# Patient Record
Sex: Female | Born: 1985 | Race: White | Hispanic: No | Smoking: Current every day smoker
Health system: Southern US, Community
[De-identification: ages and names within clinical notes are randomized; demographics above are authoritative.]

## PROBLEM LIST (undated history)

## (undated) ENCOUNTER — Emergency Department (HOSPITAL_BASED_OUTPATIENT_CLINIC_OR_DEPARTMENT_OTHER): Payer: Self-pay | Source: Home / Self Care

## (undated) DIAGNOSIS — J45909 Unspecified asthma, uncomplicated: Secondary | ICD-10-CM

## (undated) HISTORY — PX: WISDOM TOOTH EXTRACTION: SHX21

## (undated) HISTORY — PX: CYST REMOVAL HAND: SHX6279

---

## 2012-12-07 ENCOUNTER — Emergency Department (HOSPITAL_BASED_OUTPATIENT_CLINIC_OR_DEPARTMENT_OTHER)
Admission: EM | Admit: 2012-12-07 | Discharge: 2012-12-07 | Disposition: A | Discharge status: Home / Self Care | Payer: Self-pay | Attending: Emergency Medicine | Admitting: Emergency Medicine

## 2012-12-07 ENCOUNTER — Emergency Department (HOSPITAL_BASED_OUTPATIENT_CLINIC_OR_DEPARTMENT_OTHER): Payer: Self-pay

## 2012-12-07 ENCOUNTER — Encounter (HOSPITAL_BASED_OUTPATIENT_CLINIC_OR_DEPARTMENT_OTHER): Payer: Self-pay | Admitting: *Deleted

## 2012-12-07 DIAGNOSIS — Z880 Allergy status to penicillin: Secondary | ICD-10-CM | POA: Insufficient documentation

## 2012-12-07 DIAGNOSIS — J45909 Unspecified asthma, uncomplicated: Secondary | ICD-10-CM | POA: Insufficient documentation

## 2012-12-07 DIAGNOSIS — F17200 Nicotine dependence, unspecified, uncomplicated: Secondary | ICD-10-CM | POA: Insufficient documentation

## 2012-12-07 DIAGNOSIS — IMO0001 Reserved for inherently not codable concepts without codable children: Secondary | ICD-10-CM | POA: Insufficient documentation

## 2012-12-07 DIAGNOSIS — M79609 Pain in unspecified limb: Secondary | ICD-10-CM | POA: Insufficient documentation

## 2012-12-07 DIAGNOSIS — M79642 Pain in left hand: Secondary | ICD-10-CM

## 2012-12-07 DIAGNOSIS — M6740 Ganglion, unspecified site: Secondary | ICD-10-CM | POA: Insufficient documentation

## 2012-12-07 DIAGNOSIS — M7989 Other specified soft tissue disorders: Secondary | ICD-10-CM | POA: Insufficient documentation

## 2012-12-07 HISTORY — DX: Unspecified asthma, uncomplicated: J45.909

## 2012-12-07 MED ORDER — TRAMADOL HCL 50 MG PO TABS: 50.0000 mg | ORAL_TABLET | Freq: Four times a day (QID) | ORAL | Status: DC | PRN

## 2012-12-07 MED ORDER — IBUPROFEN 800 MG PO TABS
800.0000 mg | ORAL_TABLET | Freq: Once | ORAL | Status: AC
  Administered 2012-12-07: 800 mg via ORAL
  Filled 2012-12-07: qty 1

## 2012-12-07 NOTE — ED Provider Notes (Signed)
History     CSN: [id number]  Arrival date & time 12/07/12  1455   First MD Initiated Contact with Patient 12/07/12 1501      Chief Complaint  Patient presents with  . Hand Pain    (Consider location/radiation/quality/duration/timing/severity/associated sxs/prior treatment) HPI Comments: 1 week history of dorsal left hand pain without trauma. Number scan. Pain travels from the dorsum of the hand to the wrist causing some intermittent tingling in her fingers. No weakness or numbness. No fevers no pain in any other joints. She states she "busted a cyst" a week ago the pain started after that. 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  Patient is a 27 y.o. female presenting with hand pain. The history is provided by the patient.  Hand Pain This is a new problem. The current episode started more than 1 week ago. The problem occurs constantly. The problem has not changed since onset.Pertinent negatives include no chest pain, no abdominal pain, no headaches and no shortness of breath. Nothing aggravates the symptoms. Nothing relieves the symptoms. She has tried acetaminophen for the symptoms. The treatment provided mild relief.    Past Medical History  Diagnosis Date  . Asthma     History reviewed. No pertinent past surgical history.  History reviewed. No pertinent family history.  History  Substance Use Topics  . Smoking status: Current Every Day Smoker -- 0.50 packs/day    Types: Cigarettes  . Smokeless tobacco: Not on file  . Alcohol Use: No    OB History   Grav Para Term Preterm Abortions TAB SAB Ect Mult Living                  Review of Systems  Constitutional: Negative for fever, activity change and appetite change.  HENT: Negative for congestion and rhinorrhea.   Respiratory: Negative for cough, chest tightness and shortness of breath.   Cardiovascular: Negative for chest pain.  Gastrointestinal: Negative for nausea,  vomiting and abdominal pain.  Genitourinary: Negative for dysuria.  Musculoskeletal: Positive for myalgias, joint swelling and arthralgias.  Neurological: Negative for headaches.  A complete 10 system review of systems was obtained and all systems are negative except as noted in the HPI and PMH.    Allergies  Penicillins; Percocet; and Vicodin  Home Medications   Current Outpatient Rx  Name  Route  Sig  Dispense  Refill  . traMADol (ULTRAM) 50 MG tablet   Oral   Take 1 tablet (50 mg total) by mouth every 6 (six) hours as needed for pain.   15 tablet   0     BP 133/82  Pulse 92  Temp(Src) 98.5 F (36.9 C) (Oral)  Resp 18  Ht 5\' 2"  (1.575 m)  Wt 160 lb (72.576 kg)  BMI 29.26 kg/m2  SpO2 97%  LMP 11/23/2012  Physical Exam  Constitutional: She is oriented to person, place, and time. She appears well-developed and well-nourished. No distress.  HENT:  Head: Normocephalic and atraumatic.  Mouth/Throat: Oropharynx is clear and moist. No oropharyngeal exudate.  Eyes: Conjunctivae and EOM are normal. Pupils are equal, round, and reactive to light.  Neck: Normal range of motion. Neck supple.  Cardiovascular: Normal rate, regular rhythm and normal heart sounds.   No murmur heard. Pulmonary/Chest: Effort normal and breath sounds normal. No respiratory distress.  Abdominal: Soft. There is no tenderness. There is no rebound and no guarding.  Musculoskeletal: Normal range of motion. She  exhibits tenderness. She exhibits no edema.  TTP dorsal L hand. Nodule cyst on dorsal surface without induration, edema, fluctuance or erythema.  +2 radial pulse, cardinal hand movements intact  Neurological: She is alert and oriented to person, place, and time. No cranial nerve deficit. She exhibits normal muscle tone. Coordination normal.  Skin: Skin is warm.    ED Course  Procedures (including critical care time)  Labs Reviewed - No data to display Dg Wrist 2 Views Left  12/07/2012    *RADIOLOGY REPORT*  Clinical Data: Chronic wrist pain, recurrent painful cyst  LEFT WRIST - 2 VIEW  Comparison: Concurrently obtained radiographs of the left hand  Findings: Normal bony mineralization.  No aggressive lytic or blastic osseous lesion.  No abnormal periosteal reaction.  No focal soft tissue abnormality.  No acute fracture or malalignment.  The carpus is intact and the joint spaces are maintained.  IMPRESSION: Normal wrist   Original Report Authenticated By: Malachy Moan, M.D.   Dg Hand Complete Left  12/07/2012   *RADIOLOGY REPORT*  Clinical Data: Chronic left wrist pain, history of recurrent cyst  LEFT HAND - COMPLETE 3+ VIEW  Comparison: Concurrently obtained radiographs of the right wrist  Findings: Normal bony mineralization.  No aggressive lytic or blastic osseous lesion.  No abnormal periosteal reaction.  No acute fracture or malalignment.  No focal soft tissue swelling abnormality.  Joint spaces are maintained.  No inflammatory erosions.  IMPRESSION: Normal study.   Original Report Authenticated By: Malachy Moan, M.D.     1. Hand pain, left   2. Ganglion cyst       MDM  Dorsal hand pain, history of ganglion cyst. Neurovascularly intact. Appears to have cyst on dorsal hand.  Xrays negative for fracture or dislocation.  Patient reports allergies to vicodin and percocet.  She drove herself to ED.  Will treat with NSAIDs. Followup with hand surgery for management of ganglion cyst.      Glynn Octave, MD 12/07/12 1556

## 2012-12-07 NOTE — ED Notes (Signed)
Pt c/o left hand pain x 1 week

## 2012-12-13 ENCOUNTER — Encounter (HOSPITAL_COMMUNITY): Payer: Self-pay | Admitting: Emergency Medicine

## 2012-12-13 ENCOUNTER — Emergency Department (HOSPITAL_COMMUNITY)
Admission: EM | Admit: 2012-12-13 | Discharge: 2012-12-13 | Disposition: A | Discharge status: Home / Self Care | Payer: Self-pay | Attending: Emergency Medicine | Admitting: Emergency Medicine

## 2012-12-13 DIAGNOSIS — M6740 Ganglion, unspecified site: Secondary | ICD-10-CM | POA: Insufficient documentation

## 2012-12-13 DIAGNOSIS — F17200 Nicotine dependence, unspecified, uncomplicated: Secondary | ICD-10-CM | POA: Insufficient documentation

## 2012-12-13 DIAGNOSIS — R21 Rash and other nonspecific skin eruption: Secondary | ICD-10-CM | POA: Insufficient documentation

## 2012-12-13 DIAGNOSIS — M67432 Ganglion, left wrist: Secondary | ICD-10-CM

## 2012-12-13 DIAGNOSIS — Z880 Allergy status to penicillin: Secondary | ICD-10-CM | POA: Insufficient documentation

## 2012-12-13 DIAGNOSIS — J45909 Unspecified asthma, uncomplicated: Secondary | ICD-10-CM | POA: Insufficient documentation

## 2012-12-13 MED ORDER — KETOROLAC TROMETHAMINE 10 MG PO TABS: 10.0000 mg | ORAL_TABLET | Freq: Four times a day (QID) | ORAL | Status: DC | PRN

## 2012-12-13 MED ORDER — KETOROLAC TROMETHAMINE 60 MG/2ML IM SOLN
30.0000 mg | Freq: Once | INTRAMUSCULAR | Status: AC
  Administered 2012-12-13: 30 mg via INTRAMUSCULAR
  Filled 2012-12-13: qty 2

## 2012-12-13 NOTE — ED Provider Notes (Signed)
History     CSN: [id number]  Arrival date & time 12/13/12  1020   First MD Initiated Contact with Patient 12/13/12 1104      Chief Complaint  Patient presents with  . Hand Pain    (Consider location/radiation/quality/duration/timing/severity/associated sxs/prior treatment) HPI   Molly Shannon is a 27 y.o. female complaining of pain to the left dorsum of the hand and wrist starting several days ago. Patient states she was seen and evaluated had called the hand surgeon but was told at the hand surgeon could not help her and would not see her over the phone, as per the patient. She has a history of ganglionic cyst with surgery in 2007. She states the swelling has recurred and it is increasingly painful. She is taken Motrin and tramadol with little relief.  Past Medical History  Diagnosis Date  . Asthma     No past surgical history on file.  No family history on file.  History  Substance Use Topics  . Smoking status: Current Every Day Smoker -- 0.50 packs/day    Types: Cigarettes  . Smokeless tobacco: Not on file  . Alcohol Use: No    OB History   Grav Para Term Preterm Abortions TAB SAB Ect Mult Living                  Review of Systems  Constitutional: Negative for fever.  Respiratory: Negative for shortness of breath.   Cardiovascular: Negative for chest pain.  Gastrointestinal: Negative for nausea, vomiting, abdominal pain and diarrhea.  Musculoskeletal: Positive for arthralgias.  Skin: Positive for rash.  All other systems reviewed and are negative.    Allergies  Penicillins; Percocet; and Vicodin  Home Medications   Current Outpatient Rx  Name  Route  Sig  Dispense  Refill  . traMADol (ULTRAM) 50 MG tablet   Oral   Take 1 tablet (50 mg total) by mouth every 6 (six) hours as needed for pain.   15 tablet   0   . ketorolac (TORADOL) 10 MG tablet   Oral   Take 1 tablet (10 mg total) by mouth every 6 (six) hours as needed for pain (Take with food. Do  not take more than 4 per day. Do not take for longer than 5 days).   20 tablet   0     BP 122/84  Pulse 83  Temp(Src) 97.7 F (36.5 C) (Oral)  Resp 20  SpO2 100%  LMP 11/23/2012  Physical Exam  Nursing note and vitals reviewed. Constitutional: She is oriented to person, place, and time. She appears well-developed and well-nourished. No distress.  HENT:  Head: Normocephalic.  Eyes: Conjunctivae and EOM are normal.  Cardiovascular: Normal rate.   Pulmonary/Chest: Effort normal. No stridor.  Musculoskeletal: Normal range of motion.       Hands: Well-healed surgical scar over the affected area, to have third cyst tender to palpation. Patient has excellent range of motion in wrist and all fingers. Neurovascularly intact.  Neurological: She is alert and oriented to person, place, and time.  Psychiatric: She has a normal mood and affect.    ED Course  Procedures (including critical care time)  Labs Reviewed - No data to display No results found.   1. Ganglion cyst of wrist, left       MDM   Filed Vitals:   12/13/12 1032 12/13/12 1201  BP: 104/74 122/84  Pulse: 91 83  Temp: 98.5 F (36.9 C) 97.7 F (36.5 C)  TempSrc: Oral Oral  Resp: 18 20  SpO2: 99% 100%     Molly Shannon is a 27 y.o. female with recurrence of ganglion cyst to dorsum of left hand. Patient has extensive allergies and Toradol will be prescribed for pain control. I think the most and then a miscommunication between her and the hand surgeon I have encouraged her to call the hand surgeon for further evaluation.  Medications  ketorolac (TORADOL) injection 30 mg (30 mg Intramuscular Given 12/13/12 1204)    The patient is hemodynamically stable, appropriate for, and amenable to, discharge at this time. Pt verbalized understanding and agrees with care plan. Outpatient follow-up and return precautions given.    Discharge Medication List as of 12/13/2012 11:55 AM    START taking these medications   Details   ketorolac (TORADOL) 10 MG tablet Take 1 tablet (10 mg total) by mouth every 6 (six) hours as needed for pain (Take with food. Do not take more than 4 per day. Do not take for longer than 5 days)., Starting 12/13/2012, Until Discontinued, [employer], PA-C 12/13/12 1621

## 2012-12-13 NOTE — ED Notes (Signed)
Pt c/o hand pain for about the last week. States hand surgery done on her hand in the same area approx 2007. States this AM twisted her hand and now it wont stop hurting. Pt states she took 1 tramadol at 0600. Pt states she is driving. Right hand with WDL circulation checks.

## 2012-12-15 NOTE — ED Provider Notes (Signed)
Medical screening examination/treatment/procedure(s) were performed by non-physician practitioner and as supervising physician I was immediately available for consultation/collaboration.   Charles B. Sheldon, MD 12/15/12 1554 

## 2013-02-08 LAB — OB RESULTS CONSOLE HEPATITIS B SURFACE ANTIGEN: HEP B S AG: NEGATIVE

## 2013-02-08 LAB — OB RESULTS CONSOLE GC/CHLAMYDIA
CHLAMYDIA, DNA PROBE: NEGATIVE
Chlamydia: NEGATIVE
GC PROBE AMP, GENITAL: NEGATIVE
Gonorrhea: NEGATIVE

## 2013-02-08 LAB — OB RESULTS CONSOLE ABO/RH: RH Type: POSITIVE

## 2013-02-08 LAB — OB RESULTS CONSOLE ANTIBODY SCREEN: ANTIBODY SCREEN: NEGATIVE

## 2013-02-08 LAB — OB RESULTS CONSOLE HIV ANTIBODY (ROUTINE TESTING): HIV: NONREACTIVE

## 2013-02-08 LAB — OB RESULTS CONSOLE RUBELLA ANTIBODY, IGM: RUBELLA: IMMUNE

## 2013-02-08 LAB — OB RESULTS CONSOLE RPR: RPR: NONREACTIVE

## 2013-07-13 NOTE — L&D Delivery Note (Signed)
Delivery Note At 3:13 AM a viable female was delivered via Vaginal, Spontaneous Delivery (Presentation: LOA ) with loose nuchal cord, reduced over head right after delivery of the head.  APGAR: 8, 9; weight pending.   Placenta status: Intact, Spontaneous.  Cord: 3 vessels with the following complications: None.  Cord pH: not collected.  Anesthesia: Epidural  Episiotomy: None Lacerations: 2nd degree;Vaginal;Perineal;extended up right labial Suture Repair: 3.0 vicryl Est. Blood Loss (mL): 350  Mom to postpartum.  Baby to Couplet care / Skin to Skin.  Routine PP orders Breastfeeding Desires outpt circ  Molly Shannon 09/02/2013, 4:20 AM

## 2013-08-03 LAB — OB RESULTS CONSOLE GBS: GBS: NEGATIVE

## 2013-09-01 ENCOUNTER — Encounter (HOSPITAL_COMMUNITY): Payer: Medicaid Other | Admitting: Anesthesiology

## 2013-09-01 ENCOUNTER — Inpatient Hospital Stay (HOSPITAL_COMMUNITY): Payer: Medicaid Other | Admitting: Anesthesiology

## 2013-09-01 ENCOUNTER — Inpatient Hospital Stay (HOSPITAL_COMMUNITY)
Admission: AD | Admit: 2013-09-01 | Discharge: 2013-09-04 | DRG: 775 | Disposition: A | Discharge status: Home / Self Care | Payer: Medicaid Other | Source: Ambulatory Visit | Attending: Obstetrics and Gynecology | Admitting: Obstetrics and Gynecology

## 2013-09-01 ENCOUNTER — Encounter (HOSPITAL_COMMUNITY): Payer: Self-pay | Admitting: *Deleted

## 2013-09-01 DIAGNOSIS — IMO0001 Reserved for inherently not codable concepts without codable children: Secondary | ICD-10-CM

## 2013-09-01 DIAGNOSIS — D649 Anemia, unspecified: Secondary | ICD-10-CM | POA: Diagnosis not present

## 2013-09-01 DIAGNOSIS — O9903 Anemia complicating the puerperium: Secondary | ICD-10-CM | POA: Diagnosis not present

## 2013-09-01 DIAGNOSIS — O99334 Smoking (tobacco) complicating childbirth: Secondary | ICD-10-CM | POA: Diagnosis present

## 2013-09-01 LAB — CBC
HEMATOCRIT: 32.6 % — AB (ref 36.0–46.0)
HEMOGLOBIN: 11.3 g/dL — AB (ref 12.0–15.0)
MCH: 29 pg (ref 26.0–34.0)
MCHC: 34.7 g/dL (ref 30.0–36.0)
MCV: 83.8 fL (ref 78.0–100.0)
Platelets: 313 10*3/uL (ref 150–400)
RBC: 3.89 MIL/uL (ref 3.87–5.11)
RDW: 13.1 % (ref 11.5–15.5)
WBC: 16 10*3/uL — ABNORMAL HIGH (ref 4.0–10.5)

## 2013-09-01 MED ORDER — EPHEDRINE 5 MG/ML INJ
INTRAVENOUS | Status: AC
  Filled 2013-09-01: qty 4

## 2013-09-01 MED ORDER — LACTATED RINGERS IV SOLN
INTRAVENOUS | Status: DC
  Administered 2013-09-01: 20:00:00 via INTRAVENOUS

## 2013-09-01 MED ORDER — OXYTOCIN BOLUS FROM INFUSION
500.0000 mL | INTRAVENOUS | Status: DC
  Administered 2013-09-02: 500 mL via INTRAVENOUS

## 2013-09-01 MED ORDER — LACTATED RINGERS IV SOLN
500.0000 mL | Freq: Once | INTRAVENOUS | Status: DC
Start: 2013-09-01 — End: 2013-09-02

## 2013-09-01 MED ORDER — LIDOCAINE HCL (PF) 1 % IJ SOLN
INTRAMUSCULAR | Status: DC | PRN
  Administered 2013-09-01 (×2): 5 mL

## 2013-09-01 MED ORDER — EPHEDRINE 5 MG/ML INJ
10.0000 mg | INTRAVENOUS | Status: DC | PRN
  Filled 2013-09-01: qty 2

## 2013-09-01 MED ORDER — IBUPROFEN 600 MG PO TABS
600.0000 mg | ORAL_TABLET | Freq: Four times a day (QID) | ORAL | Status: DC | PRN
  Administered 2013-09-02 – 2013-09-03 (×2): 600 mg via ORAL
  Filled 2013-09-01: qty 1

## 2013-09-01 MED ORDER — PHENYLEPHRINE 40 MCG/ML (10ML) SYRINGE FOR IV PUSH (FOR BLOOD PRESSURE SUPPORT)
80.0000 ug | PREFILLED_SYRINGE | INTRAVENOUS | Status: DC | PRN
  Filled 2013-09-01: qty 2

## 2013-09-01 MED ORDER — FENTANYL 2.5 MCG/ML BUPIVACAINE 1/10 % EPIDURAL INFUSION (WH - ANES)
INTRAMUSCULAR | Status: AC
Start: 2013-09-01 — End: 2013-09-02
  Filled 2013-09-01: qty 125

## 2013-09-01 MED ORDER — LACTATED RINGERS IV SOLN: 500.0000 mL | INTRAVENOUS | Status: DC | PRN

## 2013-09-01 MED ORDER — CITRIC ACID-SODIUM CITRATE 334-500 MG/5ML PO SOLN: 30.0000 mL | ORAL | Status: DC | PRN

## 2013-09-01 MED ORDER — OXYTOCIN 40 UNITS IN LACTATED RINGERS INFUSION - SIMPLE MED
62.5000 mL/h | INTRAVENOUS | Status: DC
  Administered 2013-09-02: 62.5 mL/h via INTRAVENOUS
  Filled 2013-09-01: qty 1000

## 2013-09-01 MED ORDER — PHENYLEPHRINE 40 MCG/ML (10ML) SYRINGE FOR IV PUSH (FOR BLOOD PRESSURE SUPPORT)
PREFILLED_SYRINGE | INTRAVENOUS | Status: AC
  Filled 2013-09-01: qty 10

## 2013-09-01 MED ORDER — DIPHENHYDRAMINE HCL 50 MG/ML IJ SOLN: 12.5000 mg | INTRAMUSCULAR | Status: DC | PRN

## 2013-09-01 MED ORDER — ONDANSETRON HCL 4 MG/2ML IJ SOLN: 4.0000 mg | Freq: Four times a day (QID) | INTRAMUSCULAR | Status: DC | PRN

## 2013-09-01 MED ORDER — FENTANYL 2.5 MCG/ML BUPIVACAINE 1/10 % EPIDURAL INFUSION (WH - ANES)
14.0000 mL/h | INTRAMUSCULAR | Status: DC | PRN
  Administered 2013-09-01 – 2013-09-02 (×2): 14 mL/h via EPIDURAL
  Filled 2013-09-01: qty 125

## 2013-09-01 MED ORDER — LIDOCAINE HCL (PF) 1 % IJ SOLN
30.0000 mL | INTRAMUSCULAR | Status: AC | PRN
  Administered 2013-09-02: 30 mL via SUBCUTANEOUS
  Filled 2013-09-01: qty 30

## 2013-09-01 NOTE — Anesthesia Preprocedure Evaluation (Signed)
Anesthesia Evaluation  Patient identified by MRN, date of birth, ID band Patient awake    Reviewed: Allergy & Precautions, H&P , Patient's Chart, lab work & pertinent test results  Airway Mallampati: II TM Distance: >3 FB Neck ROM: full    Dental   Pulmonary asthma , Current Smoker,  breath sounds clear to auscultation        Cardiovascular Rhythm:regular Rate:Normal     Neuro/Psych    GI/Hepatic   Endo/Other    Renal/GU      Musculoskeletal   Abdominal   Peds  Hematology   Anesthesia Other Findings   Reproductive/Obstetrics (+) Pregnancy                          Anesthesia Physical Anesthesia Plan  ASA: II  Anesthesia Plan: Epidural   Post-op Pain Management:    Induction:   Airway Management Planned:   Additional Equipment:   Intra-op Plan:   Post-operative Plan:   Informed Consent: I have reviewed the patients History and Physical, chart, labs and discussed the procedure including the risks, benefits and alternatives for the proposed anesthesia with the patient or authorized representative who has indicated his/her understanding and acceptance.     Plan Discussed with:   Anesthesia Plan Comments:         Anesthesia Quick Evaluation  

## 2013-09-01 NOTE — H&P (Signed)
Molly Shannon is a 28 y.o. female, G1P0 at 394w3d, presenting for active labor.  Membranes swept today at the office, 3 cm / 80% / 0.  Denies LOF, recent fever, resp or GI c/o's, UTI or PIH s/s. GFM. Desires epidural.  Patient Active Problem List   Diagnosis Date Noted  . Active labor 09/01/2013    History of present pregnancy: Patient entered care at 13 weeks.   EDC of 08/29/13 was established by LMP.   Anatomy scan:  normal findings and an anterior placenta.   Additional US evaluations:  37 weeks - 6lbs 14oz, 52%, AFI 16.64, nl fluid, ant placenta, vtx.   Significant prenatal events:  1st and 2nd trimester VB; Cervical polyp removed at 28 weeks   Last evaluation:  09/01/13 at 644w3d   3 cm / 80% / 0  OB History   Grav Para Term Preterm Abortions TAB SAB Ect Mult Living   1              Past Medical History  Diagnosis Date  . Asthma    No past surgical history on file. Family History: family history is not on file. Social History:  reports that she has been smoking Cigarettes.  She has been smoking about 0.50 packs per day. She does not have any smokeless tobacco history on file. She reports that she does not drink alcohol or use illicit drugs.   Prenatal Transfer Tool  Maternal Diabetes: No Genetic Screening: Normal Maternal Ultrasounds/Referrals: Normal Fetal Ultrasounds or other Referrals:  None Maternal Substance Abuse:  No Significant Maternal Medications:  None Significant Maternal Lab Results: Lab values include: Group B Strep negative    ROS: see HPI above, all other systems are negative   Allergies  Allergen Reactions  . Penicillins   . Percocet [Oxycodone-Acetaminophen]   . Vicodin [Hydrocodone-Acetaminophen]      Dilation: 5 Effacement (%): 80 Station: 0 Exam by:: B.Cagna,Rn Blood pressure 141/93, pulse 93, temperature 97.9 F (36.6 C), temperature source Oral, resp. rate 20, last menstrual period 11/23/2012.  Chest clear Heart RRR without  murmur Abd gravid, NT Ext: WNL  FHR: Cat II UCs:  Q 2 min  Prenatal labs: ABO, Rh:  B Antibody:  pos Rubella:   Immune RPR:   Neg HBsAg:   Neg HIV:   Neg GBS: Negative (01/22 0000) Sickle cell/Hgb electrophoresis:  N/A Pap:  unknown GC:  Neg Chlamydia:  08/03/12 positive; TOC on 2/6 neg Genetic screenings:  1st trimester and AFP WNL Glucola:  128 Other:  Cervical polyp pathology neg for atypia or malignancy 06/12/13     Assessment/Plan: IUP at 764w3d Active labor GBS neg H/o pos CT culture Desires epidural  Admit to BS per c/w Dr. Dion BodyVarnado Routine L&D orders Epidural as desired   [name], JENNIFERCNM, MSN 09/01/2013, 7:52 PM

## 2013-09-01 NOTE — Progress Notes (Signed)
  Subjective: Pt is comfortable with epidural.  [facility]  Objective: BP 128/81  Pulse 90  Temp(Src) 97.8 F (36.6 C) (Oral)  Resp 18  Ht 5\' 2"  (1.575 m)  Wt 182 lb (82.555 kg)  BMI 33.28 kg/m2  SpO2 100%  LMP 11/23/2012  Breastfeeding? Unknown      FHT:  Cat II - position change UC:   regular, every 2-4 minutes  SVE:   Dilation: 7 Effacement (%): 90 Station: 0 Exam by:: B.Cagna,Rn  Assessment / Plan:  Spontaneous labor, progressing normally  Labor: Progressing normally  Preeclampsia: no signs or symptoms of toxicity  Fetal Wellbeing: Category II  Pain Control: Epidural  I/D: GBS neg; ?ROM; Afebrile  Anticipated MOD: NSVD   Phinehas Grounds 09/01/2013, 10:33 PM

## 2013-09-01 NOTE — Anesthesia Procedure Notes (Signed)
Epidural Patient location during procedure: OB Start time: 09/01/2013 8:48 PM  Staffing Anesthesiologist: Brayton CavesJACKSON, Wileen Duncanson Performed by: anesthesiologist   Preanesthetic Checklist Completed: patient identified, site marked, surgical consent, pre-op evaluation, timeout performed, IV checked, risks and benefits discussed and monitors and equipment checked  Epidural Patient position: sitting Prep: site prepped and draped and DuraPrep Patient monitoring: continuous pulse ox and blood pressure Approach: midline Injection technique: LOR air  Needle:  Needle type: Tuohy  Needle gauge: 17 G Needle length: 9 cm and 9 Needle insertion depth: 5 cm cm Catheter type: closed end flexible Catheter size: 19 Gauge Catheter at skin depth: 10 cm Test dose: negative  Assessment Events: blood not aspirated, injection not painful, no injection resistance, negative IV test and no paresthesia  Additional Notes Patient identified.  Risk benefits discussed including failed block, incomplete pain control, headache, nerve damage, paralysis, blood pressure changes, nausea, vomiting, reactions to medication both toxic or allergic, and postpartum back pain.  Patient expressed understanding and wished to proceed.  All questions were answered.  Sterile technique used throughout procedure and epidural site dressed with sterile barrier dressing. No paresthesia or other complications noted.The patient did not experience any signs of intravascular injection such as tinnitus or metallic taste in mouth nor signs of intrathecal spread such as rapid motor block. Please see nursing notes for vital signs.

## 2013-09-02 ENCOUNTER — Encounter (HOSPITAL_COMMUNITY): Payer: Self-pay | Admitting: *Deleted

## 2013-09-02 LAB — COMPREHENSIVE METABOLIC PANEL
ALT: 16 U/L (ref 0–35)
AST: 25 U/L (ref 0–37)
Albumin: 2 g/dL — ABNORMAL LOW (ref 3.5–5.2)
Alkaline Phosphatase: 129 U/L — ABNORMAL HIGH (ref 39–117)
BILIRUBIN TOTAL: 0.2 mg/dL — AB (ref 0.3–1.2)
BUN: 8 mg/dL (ref 6–23)
CHLORIDE: 102 meq/L (ref 96–112)
CO2: 24 meq/L (ref 19–32)
Calcium: 8.6 mg/dL (ref 8.4–10.5)
Creatinine, Ser: 0.7 mg/dL (ref 0.50–1.10)
GFR calc Af Amer: >90 mL/min (ref >90)
GLUCOSE: 75 mg/dL (ref 70–99)
POTASSIUM: 3.6 meq/L — AB (ref 3.7–5.3)
Sodium: 136 mEq/L — ABNORMAL LOW (ref 137–147)
Total Protein: 5.3 g/dL — ABNORMAL LOW (ref 6.0–8.3)

## 2013-09-02 LAB — CBC
HEMATOCRIT: 27.9 % — AB (ref 36.0–46.0)
HEMOGLOBIN: 9.6 g/dL — AB (ref 12.0–15.0)
MCH: 28.7 pg (ref 26.0–34.0)
MCHC: 34.4 g/dL (ref 30.0–36.0)
MCV: 83.5 fL (ref 78.0–100.0)
Platelets: 228 10*3/uL (ref 150–400)
RBC: 3.34 MIL/uL — AB (ref 3.87–5.11)
RDW: 13.2 % (ref 11.5–15.5)
WBC: 16.5 10*3/uL — ABNORMAL HIGH (ref 4.0–10.5)

## 2013-09-02 LAB — LACTATE DEHYDROGENASE: LDH: 217 U/L (ref 94–250)

## 2013-09-02 LAB — RPR: RPR Ser Ql: NONREACTIVE

## 2013-09-02 LAB — URIC ACID: Uric Acid, Serum: 6.1 mg/dL (ref 2.4–7.0)

## 2013-09-02 MED ORDER — SENNOSIDES-DOCUSATE SODIUM 8.6-50 MG PO TABS
2.0000 | ORAL_TABLET | ORAL | Status: DC
  Administered 2013-09-03 (×2): 2 via ORAL
  Filled 2013-09-02 (×2): qty 2

## 2013-09-02 MED ORDER — ONDANSETRON HCL 4 MG PO TABS: 4.0000 mg | ORAL_TABLET | ORAL | Status: DC | PRN

## 2013-09-02 MED ORDER — WITCH HAZEL-GLYCERIN EX PADS: 1.0000 "application " | MEDICATED_PAD | CUTANEOUS | Status: DC | PRN

## 2013-09-02 MED ORDER — TETANUS-DIPHTH-ACELL PERTUSSIS 5-2.5-18.5 LF-MCG/0.5 IM SUSP: 0.5000 mL | Freq: Once | INTRAMUSCULAR | Status: DC

## 2013-09-02 MED ORDER — PNEUMOCOCCAL VAC POLYVALENT 25 MCG/0.5ML IJ INJ
0.5000 mL | INJECTION | INTRAMUSCULAR | Status: AC
  Administered 2013-09-03: 0.5 mL via INTRAMUSCULAR
  Filled 2013-09-02: qty 0.5

## 2013-09-02 MED ORDER — ONDANSETRON HCL 4 MG/2ML IJ SOLN: 4.0000 mg | INTRAMUSCULAR | Status: DC | PRN

## 2013-09-02 MED ORDER — SIMETHICONE 80 MG PO CHEW: 80.0000 mg | CHEWABLE_TABLET | ORAL | Status: DC | PRN

## 2013-09-02 MED ORDER — DIPHENHYDRAMINE HCL 25 MG PO CAPS: 25.0000 mg | ORAL_CAPSULE | Freq: Four times a day (QID) | ORAL | Status: DC | PRN

## 2013-09-02 MED ORDER — LANOLIN HYDROUS EX OINT: TOPICAL_OINTMENT | CUTANEOUS | Status: DC | PRN

## 2013-09-02 MED ORDER — DIBUCAINE 1 % RE OINT: 1.0000 "application " | TOPICAL_OINTMENT | RECTAL | Status: DC | PRN

## 2013-09-02 MED ORDER — PRENATAL MULTIVITAMIN CH
1.0000 | ORAL_TABLET | Freq: Every day | ORAL | Status: DC
  Administered 2013-09-02 – 2013-09-04 (×3): 1 via ORAL
  Filled 2013-09-02 (×3): qty 1

## 2013-09-02 MED ORDER — BENZOCAINE-MENTHOL 20-0.5 % EX AERO
1.0000 "application " | INHALATION_SPRAY | CUTANEOUS | Status: DC | PRN
  Administered 2013-09-02: 1 via TOPICAL
  Filled 2013-09-02: qty 56

## 2013-09-02 MED ORDER — IBUPROFEN 600 MG PO TABS
600.0000 mg | ORAL_TABLET | Freq: Four times a day (QID) | ORAL | Status: DC
Start: 2013-09-02 — End: 2013-09-04
  Administered 2013-09-02 – 2013-09-04 (×8): 600 mg via ORAL
  Filled 2013-09-02 (×9): qty 1

## 2013-09-02 MED ORDER — ZOLPIDEM TARTRATE 5 MG PO TABS: 5.0000 mg | ORAL_TABLET | Freq: Every evening | ORAL | Status: DC | PRN

## 2013-09-02 NOTE — Progress Notes (Signed)
Called Dr. Brayton CavesFreeman Jackson, report given re: patient still recovering control of legs, cannot life bottom on bed. Instructed to keep patient for another hour, as long as numbness is resolving no need to call again.

## 2013-09-02 NOTE — Progress Notes (Signed)
This note also relates to the following rows which could not be included: Pulse Rate - Cannot attach notes to unvalidated device data SpO2 - Cannot attach notes to unvalidated device data   2nd RN in room for delivery

## 2013-09-02 NOTE — Anesthesia Postprocedure Evaluation (Signed)
  Anesthesia Post-op Note  Patient: Molly Shannon  Procedure(s) Performed: * No procedures listed *  Patient Location: Mother/Baby  Anesthesia Type:Epidural  Level of Consciousness: awake  Airway and Oxygen Therapy: Patient Spontanous Breathing  Post-op Pain: mild  Post-op Assessment: Patient's Cardiovascular Status Stable and Respiratory Function Stable  Post-op Vital Signs: stable  Complications: No apparent anesthesia complications

## 2013-09-02 NOTE — Addendum Note (Signed)
Addendum created 09/02/13 1443 by Renford DillsJanet L Dalana Pfahler, CRNA   Modules edited: Charges VN, Notes Section   Notes Section:  File: [id number]

## 2013-09-03 LAB — CBC
HCT: 26.7 % — ABNORMAL LOW (ref 36.0–46.0)
Hemoglobin: 9 g/dL — ABNORMAL LOW (ref 12.0–15.0)
MCH: 28.8 pg (ref 26.0–34.0)
MCHC: 33.7 g/dL (ref 30.0–36.0)
MCV: 85.3 fL (ref 78.0–100.0)
PLATELETS: 197 10*3/uL (ref 150–400)
RBC: 3.13 MIL/uL — ABNORMAL LOW (ref 3.87–5.11)
RDW: 13.6 % (ref 11.5–15.5)
WBC: 13.5 10*3/uL — AB (ref 4.0–10.5)

## 2013-09-03 MED ORDER — TRAMADOL HCL 50 MG PO TABS
50.0000 mg | ORAL_TABLET | Freq: Four times a day (QID) | ORAL | Status: DC | PRN
  Administered 2013-09-03 – 2013-09-04 (×3): 50 mg via ORAL
  Filled 2013-09-03 (×3): qty 1

## 2013-09-03 NOTE — Lactation Note (Signed)
This note was copied from the chart of Molly Shannon. Lactation Consultation Note Follow up visit at 41 hours of age.  Mom complains about sore nipples with small blister on right nipples.  Discussed latch and encouraged active feeding with wide flanged lips.  Comfort gels given with instructions.  Mom to call for assist as needed.   Patient Name: Molly Ladell HeadsKasey Eleazer ZOXWR'UToday's Date: 09/03/2013 Reason for consult: Follow-up assessment   Maternal Data    Feeding Feeding Type: Breast Fed Length of feed: 15 min  LATCH Score/Interventions Latch: Grasps breast easily, tongue down, lips flanged, rhythmical sucking. Intervention(s): Skin to skin  Audible Swallowing: A few with stimulation Intervention(s): Skin to skin Intervention(s): Hand expression  Type of Nipple: Everted at rest and after stimulation  Comfort (Breast/Nipple): Filling, red/small blisters or bruises, mild/mod discomfort  Problem noted: Cracked, bleeding, blisters, bruises  Hold (Positioning): No assistance needed to correctly position infant at breast. Intervention(s): Breastfeeding basics reviewed  LATCH Score: 8  Lactation Tools Discussed/Used Tools: Comfort gels   Consult Status Consult Status: Follow-up Date: 09/04/13 Follow-up type: In-patient    Beverely RisenShoptaw, Arvella MerlesJana Lynn 09/03/2013, 8:38 PM

## 2013-09-03 NOTE — Progress Notes (Signed)
Patient ID: Molly Shannon HeadsKasey Shannon, female   DOB: 1985-11-07, 28 y.o.   MRN: [id number] Post Partum Day 1, 2nd degree w R labial lac  Subjective: no complaints, up ad lib without syncope, voiding, tolerating PO,  Pain well controlled with po meds, does have difficulty sitting  BF fairly well, infant not latching at times  Mood stable, bonding well Contraception: oral contraceptive    Objective: Blood pressure 119/79, pulse 82, temperature 97.8 F (36.6 C), temperature source Oral, resp. rate 18, height 5\' 2"  (1.575 m), weight 182 lb (82.555 kg), last menstrual period 11/23/2012, SpO2 97.00%, unknown if currently breastfeeding.  Physical Exam:  General: NAD  Lochia: appropriate Uterine Fundus: firm Perineum: healing  DVT Evaluation: No evidence of DVT seen on physical exam. Negative Homan's sign. No significant calf/ankle edema.   Recent Labs  09/02/13 0842 09/03/13 0904  HGB 9.6* 9.0*  HCT 27.9* 26.7*    Assessment/Plan: Stable PPD1,  Mild asymptomatic anemia  Pt may want d/c today  Lactation consult  Start sitz bath Circumcision:  outpatient       LOS: 2 days   Molly Shannon M 09/03/2013, 9:46 AM

## 2013-09-04 ENCOUNTER — Ambulatory Visit: Payer: Self-pay

## 2013-09-04 MED ORDER — TRAMADOL HCL 50 MG PO TABS: 50.0000 mg | ORAL_TABLET | Freq: Four times a day (QID) | ORAL | Status: AC | PRN

## 2013-09-04 MED ORDER — NORETHINDRONE 0.35 MG PO TABS: 1.0000 | ORAL_TABLET | Freq: Every day | ORAL | Status: AC

## 2013-09-04 MED ORDER — IBUPROFEN 600 MG PO TABS: 600.0000 mg | ORAL_TABLET | Freq: Four times a day (QID) | ORAL | Status: AC | PRN

## 2013-09-04 MED ORDER — FERROUS SULFATE 325 (65 FE) MG PO TABS: 325.0000 mg | ORAL_TABLET | Freq: Every day | ORAL | Status: AC

## 2013-09-04 NOTE — Progress Notes (Signed)
UR chart review completed.  

## 2013-09-04 NOTE — Lactation Note (Signed)
This note was copied from the chart of Molly Shannon. Lactation Consultation Note  Patient Name: Molly Ladell HeadsKasey Locey JXBJY'NToday's Date: 09/04/2013 Reason for consult: Follow-up assessment;Hyperbilirubinemia.  Baby under phototx and asleep at this time.  Mom requested a new bottle of Gerber to use in SNS at next feeding (2000).  Mom informs LC that she is comfortable setting up and using/cleaning SNS but LC encouraged her to call for help as needed and to continue following breastfeeding/pumping recommendations of LC, Olegario MessierKathy from earlier visit today.   Maternal Data    Feeding    LATCH Score/Interventions           N/A - not a feeding at this time           Lactation Tools Discussed/Used  SNS   Consult Status Consult Status: Follow-up Date: 09/05/13 Follow-up type: In-patient    Warrick ParisianBryant, Levaughn Puccinelli [facility] 09/04/2013, 7:04 PM

## 2013-09-04 NOTE — Lactation Note (Signed)
This note was copied from the chart of Molly Ladell HeadsKasey Bidwell. Lactation Consultation Note  Patient Name: Molly Shannon EXBMW'UToday's Date: 09/04/2013 Reason for consult: Follow-up assessment;Hyperbilirubinemia Assisted Mom with positioning and obtaining good depth with latch. Baby demonstrated a good rhythmic suck, but would come on and off the breast at this feeding. No swallows noted, no colostrum present with hand expression. Baby on double phone and has had no void in more than 36 hours, now about 38 hours. Baby's lips are dry. Discussed with Mom how photo-therapy can increase risk of dehydration, jaundice makes baby sleepy at the breast so sometimes does not transfer milk well. Advised Mom to start supplements to improve baby's hydration, baby will have more energy to breastfeed. Mom does not want to give bottles but unsure how to supplement. Demonstrated 5 fr feeding tube with syringe, demonstrated foley cup. Mom will not have much help from family tonight so we decided to use SNS since baby needs more volume based on hours of age than 10 ml with feeding tube. Demonstrated set up and cleaning of SNS. Set up DEBP for Mom to post pump to encourage milk production. Plan at this time is to BF whenever baby acts hungry, but at least every 3 hours. BF on 1st breast for 15-20 minutes, use SNS to supplement on 2nd breast with EBM or formula 20 ml. Then post pump on preemie setting. Alternate each feeding the breast she uses the SNS. Ask for assist till she is comfortable setting up SNS and latching baby. Monitor voids/stools.   Maternal Data    Feeding Feeding Type: Formula Length of feed: 10 min  LATCH Score/Interventions Latch: Grasps breast easily, tongue down, lips flanged, rhythmical sucking. Intervention(s): Adjust position;Assist with latch  Audible Swallowing: Spontaneous and intermittent (w/255fr feeding tube at breast w/formula)  Type of Nipple: Everted at rest and after stimulation  Comfort  (Breast/Nipple): Filling, red/small blisters or bruises, mild/mod discomfort  Problem noted: Mild/Moderate discomfort  Hold (Positioning): Assistance needed to correctly position infant at breast and maintain latch.  LATCH Score: 8  Lactation Tools Discussed/Used Tools: 36F feeding tube / Syringe;Feeding cup;Supplemental Nutrition System;Pump Breast pump type: Double-Electric Breast Pump WIC Program: Yes   Consult Status Consult Status: Follow-up Date: 09/04/13 Follow-up type: In-patient    Alfred LevinsGranger, Woodruff Skirvin Ann 09/04/2013, 4:47 PM

## 2013-09-04 NOTE — Discharge Instructions (Signed)
Postpartum Care After Vaginal Delivery °After you deliver your newborn (postpartum period), the usual stay in the hospital is 24 72 hours. If there were problems with your labor or delivery, or if you have other medical problems, you might be in the hospital longer.  °While you are in the hospital, you will receive help and instructions on how to care for yourself and your newborn during the postpartum period.  °While you are in the hospital: °· Be sure to tell your nurses if you have pain or discomfort, as well as where you feel the pain and what makes the pain worse. °· If you had an incision made near your vagina (episiotomy) or if you had some tearing during delivery, the nurses may put ice packs on your episiotomy or tear. The ice packs may help to reduce the pain and swelling. °· If you are breastfeeding, you may feel uncomfortable contractions of your uterus for a couple of weeks. This is normal. The contractions help your uterus get back to normal size. °· It is normal to have some bleeding after delivery. °· For the first 1 3 days after delivery, the flow is red and the amount may be similar to a period. °· It is common for the flow to start and stop. °· In the first few days, you may pass some small clots. Let your nurses know if you begin to pass large clots or your flow increases. °· Do not  flush blood clots down the toilet before having the nurse look at them. °· During the next 3 10 days after delivery, your flow should become more watery and pink or brown-tinged in color. °· Ten to fourteen days after delivery, your flow should be a small amount of yellowish-white discharge. °· The amount of your flow will decrease over the first few weeks after delivery. Your flow may stop in 6 8 weeks. Most women have had their flow stop by 12 weeks after delivery. °· You should change your sanitary pads frequently. °· Wash your hands thoroughly with soap and water for at least 20 seconds after changing pads, using  the toilet, or before holding or feeding your newborn. °· You should feel like you need to empty your bladder within the first 6 8 hours after delivery. °· In case you become weak, lightheaded, or faint, call your nurse before you get out of bed for the first time and before you take a shower for the first time. °· Within the first few days after delivery, your breasts may begin to feel tender and full. This is called engorgement. Breast tenderness usually goes away within 48 72 hours after engorgement occurs. You may also notice milk leaking from your breasts. If you are not breastfeeding, do not stimulate your breasts. Breast stimulation can make your breasts produce more milk. °· Spending as much time as possible with your newborn is very important. During this time, you and your newborn can feel close and get to know each other. Having your newborn stay in your room (rooming in) will help to strengthen the bond with your newborn.  It will give you time to get to know your newborn and become comfortable caring for your newborn. °· Your hormones change after delivery. Sometimes the hormone changes can temporarily cause you to feel sad or tearful. These feelings should not last more than a few days. If these feelings last longer than that, you should talk to your caregiver. °· If desired, talk to your caregiver about   methods of family planning or contraception.  Talk to your caregiver about immunizations. Your caregiver may want you to have the following immunizations before leaving the hospital:  Tetanus, diphtheria, and pertussis (Tdap) or tetanus and diphtheria (Td) immunization. It is very important that you and your family (including grandparents) or others caring for your newborn are up-to-date with the Tdap or Td immunizations. The Tdap or Td immunization can help protect your newborn from getting ill.  Rubella immunization.  Varicella (chickenpox) immunization.  Influenza immunization. You should  receive this annual immunization if you did not receive the immunization during your pregnancy. Document Released: 04/26/2007 Document Revised: 03/23/2012 Document Reviewed: 02/24/2012 [facility] Patient Information 2014 [place].  Postpartum Depression and Baby Blues The postpartum period begins right after the birth of a baby. During this time, there is often a great amount of joy and excitement. It is also a time of considerable changes in the life of the parent(s). Regardless of how many times a mother gives birth, each child brings new challenges and dynamics to the family. It is not unusual to have feelings of excitement accompanied by confusing shifts in moods, emotions, and thoughts. All mothers are at risk of developing postpartum depression or the "baby blues." These mood changes can occur right after giving birth, or they may occur many months after giving birth. The baby blues or postpartum depression can be mild or severe. Additionally, postpartum depression can resolve rather quickly, or it can be a long-term condition. CAUSES Elevated hormones and their rapid decline are thought to be a main cause of postpartum depression and the baby blues. There are a number of hormones that radically change during and after pregnancy. Estrogen and progesterone usually decrease immediately after delivering your baby. The level of thyroid hormone and various cortisol steroids also rapidly drop. Other factors that play a major role in these changes include major life events and genetics.  RISK FACTORS If you have any of the following risks for the baby blues or postpartum depression, know what symptoms to watch out for during the postpartum period. Risk factors that may increase the likelihood of getting the baby blues or postpartum depression include:  Havinga personal or family history of depression.  Having depression while being pregnant.  Having premenstrual or oral contraceptive-associated  mood issues.  Having exceptional life stress.  Having marital conflict.  Lacking a social support network.  Having a baby with special needs.  Having health problems such as diabetes. SYMPTOMS Baby blues symptoms include:  Brief fluctuations in mood, such as going from extreme happiness to sadness.  Decreased concentration.  Difficulty sleeping.  Crying spells, tearfulness.  Irritability.  Anxiety. Postpartum depression symptoms typically begin within the first month after giving birth. These symptoms include:  Difficulty sleeping or excessive sleepiness.  Marked weight loss.  Agitation.  Feelings of worthlessness.  Lack of interest in activity or food. Postpartum psychosis is a very concerning condition and can be dangerous. Fortunately, it is rare. Displaying any of the following symptoms is cause for immediate medical attention. Postpartum psychosis symptoms include:  Hallucinations and delusions.  Bizarre or disorganized behavior.  Confusion or disorientation. DIAGNOSIS  A diagnosis is made by an evaluation of your symptoms. There are no medical or lab tests that lead to a diagnosis, but there are various questionnaires that a caregiver may use to identify those with the baby blues, postpartum depression, or psychosis. Often times, a screening tool called the [place] Postnatal Depression Scale is used to diagnose  depression in the postpartum period.  °TREATMENT °The baby blues usually goes away on its own in 1 to 2 weeks. Social support is often all that is needed. You should be encouraged to get adequate sleep and rest. Occasionally, you may be given medicines to help you sleep.  °Postpartum depression requires treatment as it can last several months or longer if it is not treated. Treatment may include individual or group therapy, medicine, or both to address any social, physiological, and psychological factors that may play a role in the depression. Regular  exercise, a healthy diet, rest, and social support may also be strongly recommended.  °Postpartum psychosis is more serious and needs treatment right away. Hospitalization is often needed. °HOME CARE INSTRUCTIONS °· Get as much rest as you can. Nap when the baby sleeps. °· Exercise regularly. Some women find yoga and walking to be beneficial. °· Eat a balanced and nourishing diet. °· Do little things that you enjoy. Have a cup of tea, take a bubble bath, read your favorite magazine, or listen to your favorite music. °· Avoid alcohol. °· Ask for help with household chores, cooking, grocery shopping, or running errands as needed. Do not try to do everything. °· Talk to people close to you about how you are feeling. Get support from your partner, family members, friends, or other new moms. °· Try to stay positive in how you think. Think about the things you are grateful for. °· Do not spend a lot of time alone. °· Only take medicine as directed by your caregiver. °· Keep all your postpartum appointments. °· Let your caregiver know if you have any concerns. °SEEK MEDICAL CARE IF: °You are having a reaction or problems with your medicine. °SEEK IMMEDIATE MEDICAL CARE IF: °· You have suicidal feelings. °· You feel you may harm the baby or someone else. °Document Released: 04/02/2004 Document Revised: 09/21/2011 Document Reviewed: 04/10/2013 °[employer]® Patient Information ©2014 [employer]. ° °Breastfeeding °Deciding to breastfeed is one of the best choices you can make for you and your baby. A change in hormones during pregnancy causes your breast tissue to grow and increases the number and size of your milk ducts. These hormones also allow proteins, sugars, and fats from your blood supply to make breast milk in your milk-producing glands. Hormones prevent breast milk from being released before your baby is born as well as prompt milk flow after birth. Once breastfeeding has begun, thoughts of your baby, as well as his  or her sucking or crying, can stimulate the release of milk from your milk-producing glands.  °BENEFITS OF BREASTFEEDING °For Your Baby °· Your first milk (colostrum) helps your baby's digestive system function better.   °· There are antibodies in your milk that help your baby fight off infections.   °· Your baby has a lower incidence of asthma, allergies, and sudden infant death syndrome.   °· The nutrients in breast milk are better for your baby than infant formulas and are designed uniquely for your baby's needs.   °· Breast milk improves your baby's brain development.   °· Your baby is less likely to develop other conditions, such as childhood obesity, asthma, or type 2 diabetes mellitus.   °For You  °· Breastfeeding helps to create a very special bond between you and your baby.   °· Breastfeeding is convenient. Breast milk is always available at the correct temperature and costs nothing.   °· Breastfeeding helps to burn calories and helps you lose the weight gained during pregnancy.   °· Breastfeeding makes   your uterus contract to its prepregnancy size faster and slows bleeding (lochia) after you give birth.   Breastfeeding helps to lower your risk of developing type 2 diabetes mellitus, osteoporosis, and breast or ovarian cancer later in life. SIGNS THAT YOUR BABY IS HUNGRY Early Signs of Hunger  Increased alertness or activity.  Stretching.  Movement of the head from side to side.  Movement of the head and opening of the mouth when the corner of the mouth or cheek is stroked (rooting).  Increased sucking sounds, smacking lips, cooing, sighing, or squeaking.  Hand-to-mouth movements.  Increased sucking of fingers or hands. Late Signs of Hunger  Fussing.  Intermittent crying. Extreme Signs of Hunger Signs of extreme hunger will require calming and consoling before your baby will be able to breastfeed successfully. Do not wait for the following signs of extreme hunger to occur before  you initiate breastfeeding:   Restlessness.  A loud, strong cry.   Screaming. BREASTFEEDING BASICS Breastfeeding Initiation  Find a comfortable place to sit or lie down, with your neck and back well supported.  Place a pillow or rolled up blanket under your baby to bring him or her to the level of your breast (if you are seated). Nursing pillows are specially designed to help support your arms and your baby while you breastfeed.  Make sure that your baby's abdomen is facing your abdomen.   Gently massage your breast. With your fingertips, massage from your chest wall toward your nipple in a circular motion. This encourages milk flow. You may need to continue this action during the feeding if your milk flows slowly.  Support your breast with 4 fingers underneath and your thumb above your nipple. Make sure your fingers are well away from your nipple and your baby's mouth.   Stroke your baby's lips gently with your finger or nipple.   When your baby's mouth is open wide enough, quickly bring your baby to your breast, placing your entire nipple and as much of the colored area around your nipple (areola) as possible into your baby's mouth.   More areola should be visible above your baby's upper lip than below the lower lip.   Your baby's tongue should be between his or her lower gum and your breast.   Ensure that your baby's mouth is correctly positioned around your nipple (latched). Your baby's lips should create a seal on your breast and be turned out (everted).  It is common for your baby to suck about 2 3 minutes in order to start the flow of breast milk. Latching Teaching your baby how to latch on to your breast properly is very important. An improper latch can cause nipple pain and decreased milk supply for you and poor weight gain in your baby. Also, if your baby is not latched onto your nipple properly, he or she may swallow some air during feeding. This can make your baby  fussy. Burping your baby when you switch breasts during the feeding can help to get rid of the air. However, teaching your baby to latch on properly is still the best way to prevent fussiness from swallowing air while breastfeeding. Signs that your baby has successfully latched on to your nipple:    Silent tugging or silent sucking, without causing you pain.   Swallowing heard between every 3 4 sucks.    Muscle movement above and in front of his or her ears while sucking.  Signs that your baby has not successfully latched on  to nipple:   Sucking sounds or smacking sounds from your baby while breastfeeding.  Nipple pain. If you think your baby has not latched on correctly, slip your finger into the corner of your baby's mouth to break the suction and place it between your baby's gums. Attempt breastfeeding initiation again. Signs of Successful Breastfeeding Signs from your baby:   A gradual decrease in the number of sucks or complete cessation of sucking.   Falling asleep.   Relaxation of his or her body.   Retention of a small amount of milk in his or her mouth.   Letting go of your breast by himself or herself. Signs from you:  Breasts that have increased in firmness, weight, and size 1 3 hours after feeding.   Breasts that are softer immediately after breastfeeding.  Increased milk volume, as well as a change in milk consistency and color by the 5th day of breastfeeding.   Nipples that are not sore, cracked, or bleeding. Signs That Your [employer] is Getting Enough Milk  Wetting at least 3 diapers in a 24-hour period. The urine should be clear and pale yellow by age 511 days.  At least 3 stools in a 24-hour period by age 511 days. The stool should be soft and yellow.  At least 3 stools in a 24-hour period by age 27 days. The stool should be seedy and yellow.  No loss of weight greater than 10% of birth weight during the first 25 days of age.  Average weight gain of 4 7  ounces (120 210 mL) per week after age 51 days.  Consistent daily weight gain by age 8 days, without weight loss after the age of 2 weeks. After a feeding, your baby may spit up a small amount. This is common. BREASTFEEDING FREQUENCY AND DURATION Frequent feeding will help you make more milk and can prevent sore nipples and breast engorgement. Breastfeed when you feel the need to reduce the fullness of your breasts or when your baby shows signs of hunger. This is called "breastfeeding on demand." Avoid introducing a pacifier to your baby while you are working to establish breastfeeding (the first 4 6 weeks after your baby is born). After this time you may choose to use a pacifier. Research has shown that pacifier use during the first year of a baby's life decreases the risk of sudden infant death syndrome (SIDS). Allow your baby to feed on each breast as long as he or she wants. Breastfeed until your baby is finished feeding. When your baby unlatches or falls asleep while feeding from the first breast, offer the second breast. Because newborns are often sleepy in the first few weeks of life, you may need to awaken your baby to get him or her to feed. Breastfeeding times will vary from baby to baby. However, the following rules can serve as a guide to help you ensure that your baby is properly fed:  Newborns (babies 57 weeks of age or younger) may breastfeed every 1 3 hours.  Newborns should not go longer than 3 hours during the day or 5 hours during the night without breastfeeding.  You should breastfeed your baby a minimum of 8 times in a 24-hour period until you begin to introduce solid foods to your baby at around 87 months of age. BREAST MILK PUMPING Pumping and storing breast milk allows you to ensure that your baby is exclusively fed your breast milk, even at times when you are unable to breastfeed. This  is especially important if you are going back to work while you are still breastfeeding or when  you are not able to be present during feedings. Your lactation consultant can give you guidelines on how long it is safe to store breast milk.  A breast pump is a machine that allows you to pump milk from your breast into a sterile bottle. The pumped breast milk can then be stored in a refrigerator or freezer. Some breast pumps are operated by hand, while others use electricity. Ask your lactation consultant which type will work best for you. Breast pumps can be purchased, but some hospitals and breastfeeding support groups lease breast pumps on a monthly basis. A lactation consultant can teach you how to hand express breast milk, if you prefer not to use a pump.  CARING FOR YOUR BREASTS WHILE YOU BREASTFEED Nipples can become dry, cracked, and sore while breastfeeding. The following recommendations can help keep your breasts moisturized and healthy:  Avoid using soap on your nipples.   Wear a supportive bra. Although not required, special nursing bras and tank tops are designed to allow access to your breasts for breastfeeding without taking off your entire bra or top. Avoid wearing underwire style bras or extremely tight bras.  Air dry your nipples for 3 5mnutes after each feeding.   Use only cotton bra pads to absorb leaked breast milk. Leaking of breast milk between feedings is normal.   Use lanolin on your nipples after breastfeeding. Lanolin helps to maintain your skin's normal moisture barrier. If you use pure lanolin you do not need to wash it off before feeding your baby again. Pure lanolin is not toxic to your baby. You may also hand express a few drops of breast milk and gently massage that milk into your nipples and allow the milk to air dry. In the first few weeks after giving birth, some women experience extremely full breasts (engorgement). Engorgement can make your breasts feel heavy, warm, and tender to the touch. Engorgement peaks within 3 5 days after you give birth. The  following recommendations can help ease engorgement:  Completely empty your breasts while breastfeeding or pumping. You may want to start by applying warm, moist heat (in the shower or with warm water-soaked hand towels) just before feeding or pumping. This increases circulation and helps the milk flow. If your baby does not completely empty your breasts while breastfeeding, pump any extra milk after he or she is finished.  Wear a snug bra (nursing or regular) or tank top for 1 2 days to signal your body to slightly decrease milk production.  Apply ice packs to your breasts, unless this is too uncomfortable for you.  Make sure that your baby is latched on and positioned properly while breastfeeding. If engorgement persists after 48 hours of following these recommendations, contact your health care provider or a lScience writer OVERALL HEALTH CARE RECOMMENDATIONS WHILE BREASTFEEDING  Eat healthy foods. Alternate between meals and snacks, eating 3 of each per day. Because what you eat affects your breast milk, some of the foods may make your baby more irritable than usual. Avoid eating these foods if you are sure that they are negatively affecting your baby.  Drink milk, fruit juice, and water to satisfy your thirst (about 10 glasses a day).   Rest often, relax, and continue to take your prenatal vitamins to prevent fatigue, stress, and anemia.  Continue breast self-awareness checks.  Avoid chewing and smoking tobacco.  Avoid alcohol and  drug use. Some medicines that may be harmful to your baby can pass through breast milk. It is important to ask your health care provider before taking any medicine, including all over-the-counter and prescription medicine as well as vitamin and herbal supplements. It is possible to become pregnant while breastfeeding. If birth control is desired, ask your health care provider about options that will be safe for your baby. SEEK MEDICAL CARE IF:   You  feel like you want to stop breastfeeding or have become frustrated with breastfeeding.  You have painful breasts or nipples.  Your nipples are cracked or bleeding.  Your breasts are red, tender, or warm.  You have a swollen area on either breast.  You have a fever or chills.  You have nausea or vomiting.  You have drainage other than breast milk from your nipples.  Your breasts do not become full before feedings by the 5th day after you give birth.  You feel sad and depressed.  Your baby is too sleepy to eat well.  Your baby is having trouble sleeping.   Your baby is wetting less than 3 diapers in a 24-hour period.  Your baby has less than 3 stools in a 24-hour period.  Your baby's skin or the white part of his or her eyes becomes yellow.   Your baby is not gaining weight by 84 days of age. SEEK IMMEDIATE MEDICAL CARE IF:   Your baby is overly tired (lethargic) and does not want to wake up and feed.  Your baby develops an unexplained fever. Document Released: 06/29/2005 Document Revised: 03/01/2013 Document Reviewed: 12/21/2012 [facility] Patient Information 2014 [place].  Iron-Rich Diet An iron-rich diet contains foods that are good sources of iron. Iron is an important mineral that helps your body produce hemoglobin. Hemoglobin is a protein in red blood cells that carries oxygen to the body's tissues. Sometimes, the iron level in your blood can be low. This may be caused by:  A lack of iron in your diet.  Blood loss.  Times of growth, such as during pregnancy or during a child's growth and development. Low levels of iron can cause a decrease in the number of red blood cells. This can result in iron deficiency anemia. Iron deficiency anemia symptoms include:  Tiredness.  Weakness.  Irritability.  Increased chance of infection. Here are some recommendations for daily iron intake:  Males older than 26 years of age need 8 mg of iron per day.  Women  ages 66 to 75 need 18 mg of iron per day.  Pregnant women need 27 mg of iron per day, and women who are over 66 years of age and breastfeeding need 9 mg of iron per day.  Women over the age of 60 need 8 mg of iron per day. SOURCES OF IRON There are 2 types of iron that are found in food: heme iron and nonheme iron. Heme iron is absorbed by the body better than nonheme iron. Heme iron is found in meat, poultry, and fish. Nonheme iron is found in grains, beans, and vegetables. Heme Iron Sources Food / Iron (mg)  Chicken liver, 3 oz (85 g)/ 10 mg  Beef liver, 3 oz (85 g)/ 5.5 mg  Oysters, 3 oz (85 g)/ 8 mg  Beef, 3 oz (85 g)/ 2 to 3 mg  Shrimp, 3 oz (85 g)/ 2.8 mg  [place], 3 oz (85 g)/ 2 mg  Chicken, 3 oz (85 g) / 1 mg  Fish (tuna, halibut),  3 oz (85 g)/ 1 mg  Pork, 3 oz (85 g)/ 0.9 mg Nonheme Iron Sources Food / Iron (mg)  Ready-to-eat breakfast cereal, iron-fortified / 3.9 to 7 mg  Tofu,  cup / 3.4 mg  Kidney beans,  cup / 2.6 mg  Baked potato with skin / 2.7 mg  Asparagus,  cup / 2.2 mg  Avocado / 2 mg  Dried peaches,  cup / 1.6 mg  Raisins,  cup / 1.5 mg  Soy milk, 1 cup / 1.5 mg  Whole-wheat bread, 1 slice / 1.2 mg  Spinach, 1 cup / 0.8 mg  Broccoli,  cup / 0.6 mg IRON ABSORPTION Certain foods can decrease the body's absorption of iron. Try to avoid these foods and beverages while eating meals with iron-containing foods:  Coffee.  Tea.  Fiber.  Soy. Foods containing vitamin C can help increase the amount of iron your body absorbs from iron sources, especially from nonheme sources. Eat foods with vitamin C along with iron-containing foods to increase your iron absorption. Foods that are high in vitamin C include many fruits and vegetables. Some good sources are:  Fresh orange juice.  Oranges.  Strawberries.  Mangoes.  Grapefruit.  Red bell peppers.  Green bell peppers.  Broccoli.  Potatoes with skin.  Tomato juice. Document  Released: 02/10/2005 Document Revised: 09/21/2011 Document Reviewed: 12/18/2010 [facility] Patient Information 2014 [place], [place].  Oral Contraception Information Oral contraceptive pills (OCPs) are medicines taken to prevent pregnancy. OCPs work by preventing the ovaries from releasing eggs. The hormones in OCPs also cause the cervical mucus to thicken, preventing the sperm from entering the uterus. The hormones also cause the uterine lining to become thin, not allowing a fertilized egg to attach to the inside of the uterus. OCPs are highly effective when taken exactly as prescribed. However, OCPs do not prevent sexually transmitted diseases (STDs). Safe sex practices, such as using condoms along with the pill, can help prevent STDs.  Before taking the pill, you may have a physical exam and Pap test. Your health care provider may order blood tests. The health care provider will make sure you are a good candidate for oral contraception. Discuss with your health care provider the possible side effects of the OCP you may be prescribed. When starting an OCP, it can take 2 to 3 months for the body to adjust to the changes in hormone levels in your body.  TYPES OF ORAL CONTRACEPTION  The combination pill This pill contains estrogen and progestin (synthetic progesterone) hormones. The combination pill comes in 21-day, 28-day, or 91-day packs. Some types of combination pills are meant to be taken continuously (365-day pills). With 21-day packs, you do not take pills for 7 days after the last pill. With 28-day packs, the pill is taken every day. The last 7 pills are without hormones. Certain types of pills have more than 21 hormone-containing pills. With 91-day packs, the first 84 pills contain both hormones, and the last 7 pills contain no hormones or contain estrogen only.  The minipill This pill contains the progesterone hormone only. The pill is taken every day continuously. It is very important to take the  pill at the same time each day. The minipill comes in packs of 28 pills. All 28 pills contain the hormone.  ADVANTAGES OF ORAL CONTRACEPTIVE PILLS  Decreases premenstrual symptoms.   Treats menstrual period cramps.   Regulates the menstrual cycle.   Decreases a heavy menstrual flow.   May treatacne, depending on  the type of pill.   Treats abnormal uterine bleeding.   Treats polycystic ovarian syndrome.   Treats endometriosis.   Can be used as emergency contraception.  THINGS THAT CAN MAKE ORAL CONTRACEPTIVE PILLS LESS EFFECTIVE OCPs can be less effective if:   You forget to take the pill at the same time every day.   You have a stomach or intestinal disease that lessens the absorption of the pill.   You take OCPs with other medicines that make OCPs less effective, such as antibiotics, certain HIV medicines, and some seizure medicines.   You take expired OCPs.   You forget to restart the pill on day 7, when using the packs of 21 pills.  RISKS ASSOCIATED WITH ORAL CONTRACEPTIVE PILLS  Oral contraceptive pills can sometimes cause side effects, such as:  Headache.  Nausea.  Breast tenderness.  Irregular bleeding or spotting. Combination pills are also associated with a small increased risk of:  Blood clots.  Heart attack.  Stroke. Document Released: 09/19/2002 Document Revised: 04/19/2013 Document Reviewed: 12/18/2012 [facility] Patient Information 2014 [place].

## 2013-09-04 NOTE — Discharge Summary (Signed)
Vaginal Delivery Discharge Summary  Molly Shannon  DOB:    Feb 13, 1986 MRN:    [id number] CSN:    [id number]  Date of admission:                  09/01/13  Date of discharge:                   09/04/13  Procedures this admission: SVD with a 2nd degree laceration extending up right labia  Date of Delivery: 09/02/13 by Haroldine LawsJennifer Daphnie Shannon  Newborn Data:  Live born female  Birth Weight: 7 lb 1 oz (3204 g) APGAR: 8, 9  Home with mother. Circumcision Plan: Outpt  History of Present Illness:  Ms. Molly Shannon is a 28 y.o. female, G2P1001, who presents at 7123w3d weeks gestation. [facility] She was admitted onset of labor. Her pregnancy has been complicated by:   Patient Active Problem List   Diagnosis Date Noted  . NSVD (normal spontaneous vaginal delivery) 09/02/2013    Hospital course:  The patient was admitted for active labor.   Her labor was not complicated. She proceeded to have a vaginal delivery of a healthy infant. Her delivery was not complicated. Her postpartum course was not complicated.  She was discharged to home on postpartum day 2 doing well.  Feeding:  breast  Contraception:  oral progesterone-only contraceptive  Discharge hemoglobin:  Hemoglobin  Date Value Ref Range Status  09/03/2013 9.0* 12.0 - 15.0 g/dL Final     HCT  Date Value Ref Range Status  09/03/2013 26.7* 36.0 - 46.0 % Final    Discharge Physical Exam:   General: alert, cooperative and no distress Lochia: appropriate Uterine Fundus: firm Incision: healing well DVT Evaluation: No evidence of DVT seen on physical exam. Negative Homan's sign.  Intrapartum Procedures: spontaneous vaginal delivery Postpartum Procedures: none Complications-Operative and Postpartum: 2nd degree perineal laceration extending up right labia  Discharge Diagnoses: Term Pregnancy-delivered  Discharge  Information:  Activity:           pelvic rest Diet:                routine Medications: PNV, Ibuprofen, Iron and Ultram Condition:      stable Instructions:   Postpartum Care After Vaginal Delivery  After you deliver your newborn (postpartum period), the usual stay in the hospital is 24 72 hours. If there were problems with your labor or delivery, or if you have other medical problems, you might be in the hospital longer.  While you are in the hospital, you will receive help and instructions on how to care for yourself and your newborn during the postpartum period.  While you are in the hospital:  Be sure to tell your nurses if you have pain or discomfort, as well as where you feel the pain and what makes the pain worse.  If you had an incision made near your vagina (episiotomy) or if you had some tearing during delivery, the nurses may put ice packs on your episiotomy or tear. The ice packs may help to reduce the pain and swelling.  If you are breastfeeding, you may feel uncomfortable contractions of your uterus for a couple of weeks. This is normal. The contractions help your uterus get back to normal size.  It is normal to have some bleeding after delivery.  For the first 1 3 days after delivery, the flow is red  and the amount may be similar to a period.  It is common for the flow to start and stop.  In the first few days, you may pass some small clots. Let your nurses know if you begin to pass large clots or your flow increases.  Do not  flush blood clots down the toilet before having the nurse look at them.  During the next 3 10 days after delivery, your flow should become more watery and pink or brown-tinged in color.  Ten to fourteen days after delivery, your flow should be a small amount of yellowish-white discharge.  The amount of your flow will decrease over the first few weeks after delivery. Your flow may stop in 6 8 weeks. Most women have had their flow stop by 12 weeks  after delivery.  You should change your sanitary pads frequently.  Wash your hands thoroughly with soap and water for at least 20 seconds after changing pads, using the toilet, or before holding or feeding your newborn.  You should feel like you need to empty your bladder within the first 6 8 hours after delivery.  In case you become weak, lightheaded, or faint, call your nurse before you get out of bed for the first time and before you take a shower for the first time.  Within the first few days after delivery, your breasts may begin to feel tender and full. This is called engorgement. Breast tenderness usually goes away within 48 72 hours after engorgement occurs. You may also notice milk leaking from your breasts. If you are not breastfeeding, do not stimulate your breasts. Breast stimulation can make your breasts produce more milk.  Spending as much time as possible with your newborn is very important. During this time, you and your newborn can feel close and get to know each other. Having your newborn stay in your room (rooming in) will help to strengthen the bond with your newborn. It will give you time to get to know your newborn and become comfortable caring for your newborn.  Your hormones change after delivery. Sometimes the hormone changes can temporarily cause you to feel sad or tearful. These feelings should not last more than a few days. If these feelings last longer than that, you should talk to your caregiver.  If desired, talk to your caregiver about methods of family planning or contraception.  Talk to your caregiver about immunizations. Your caregiver may want you to have the following immunizations before leaving the hospital:  Tetanus, diphtheria, and pertussis (Tdap) or tetanus and diphtheria (Td) immunization. It is very important that you and your family (including grandparents) or others caring for your newborn are up-to-date with the Tdap or Td immunizations. The Tdap  or Td immunization can help protect your newborn from getting ill.  Rubella immunization.  Varicella (chickenpox) immunization.  Influenza immunization. You should receive this annual immunization if you did not receive the immunization during your pregnancy. Document Released: 04/26/2007 Document Revised: 03/23/2012 Document Reviewed: 02/24/2012 [facility] Patient Information 2014 [place], [place].   Postpartum Depression and Baby Blues  The postpartum period begins right after the birth of a baby. During this time, there is often a great amount of joy and excitement. It is also a time of considerable changes in the life of the parent(s). Regardless of how many times a mother gives birth, each child brings new challenges and dynamics to the family. It is not unusual to have feelings of excitement accompanied by confusing shifts in moods, emotions,  and thoughts. All mothers are at risk of developing postpartum depression or the "baby blues." These mood changes can occur right after giving birth, or they may occur many months after giving birth. The baby blues or postpartum depression can be mild or severe. Additionally, postpartum depression can resolve rather quickly, or it can be a long-term condition. CAUSES Elevated hormones and their rapid decline are thought to be a main cause of postpartum depression and the baby blues. There are a number of hormones that radically change during and after pregnancy. Estrogen and progesterone usually decrease immediately after delivering your baby. The level of thyroid hormone and various cortisol steroids also rapidly drop. Other factors that play a major role in these changes include major life events and genetics.  RISK FACTORS If you have any of the following risks for the baby blues or postpartum depression, know what symptoms to watch out for during the postpartum period. Risk factors that may increase the likelihood of getting the baby blues or postpartum  depression include:  Havinga personal or family history of depression.  Having depression while being pregnant.  Having premenstrual or oral contraceptive-associated mood issues.  Having exceptional life stress.  Having marital conflict.  Lacking a social support network.  Having a baby with special needs.  Having health problems such as diabetes. SYMPTOMS Baby blues symptoms include:  Brief fluctuations in mood, such as going from extreme happiness to sadness.  Decreased concentration.  Difficulty sleeping.  Crying spells, tearfulness.  Irritability.  Anxiety. Postpartum depression symptoms typically begin within the first month after giving birth. These symptoms include:  Difficulty sleeping or excessive sleepiness.  Marked weight loss.  Agitation.  Feelings of worthlessness.  Lack of interest in activity or food. Postpartum psychosis is a very concerning condition and can be dangerous. Fortunately, it is rare. Displaying any of the following symptoms is cause for immediate medical attention. Postpartum psychosis symptoms include:  Hallucinations and delusions.  Bizarre or disorganized behavior.  Confusion or disorientation. DIAGNOSIS  A diagnosis is made by an evaluation of your symptoms. There are no medical or lab tests that lead to a diagnosis, but there are various questionnaires that a caregiver may use to identify those with the baby blues, postpartum depression, or psychosis. Often times, a screening tool called the [place] Postnatal Depression Scale is used to diagnose depression in the postpartum period.  TREATMENT The baby blues usually goes away on its own in 1 to 2 weeks. Social support is often all that is needed. You should be encouraged to get adequate sleep and rest. Occasionally, you may be given medicines to help you sleep.  Postpartum depression requires treatment as it can last several months or longer if it is not treated. Treatment may  include individual or group therapy, medicine, or both to address any social, physiological, and psychological factors that may play a role in the depression. Regular exercise, a healthy diet, rest, and social support may also be strongly recommended.  Postpartum psychosis is more serious and needs treatment right away. Hospitalization is often needed. HOME CARE INSTRUCTIONS  Get as much rest as you can. Nap when the baby sleeps.  Exercise regularly. Some women find yoga and walking to be beneficial.  Eat a balanced and nourishing diet.  Do little things that you enjoy. Have a cup of tea, take a bubble bath, read your favorite magazine, or listen to your favorite music.  Avoid alcohol.  Ask for help with household chores, cooking, grocery shopping, or  running errands as needed. Do not try to do everything.  Talk to people close to you about how you are feeling. Get support from your partner, family members, friends, or other new moms.  Try to stay positive in how you think. Think about the things you are grateful for.  Do not spend a lot of time alone.  Only take medicine as directed by your caregiver.  Keep all your postpartum appointments.  Let your caregiver know if you have any concerns. SEEK MEDICAL CARE IF: You are having a reaction or problems with your medicine. SEEK IMMEDIATE MEDICAL CARE IF:  You have suicidal feelings.  You feel you may harm the baby or someone else. Document Released: 04/02/2004 Document Revised: 09/21/2011 Document Reviewed: 05/05/2011 [facility] Patient Information 2014 [place], [place].   Discharge to: home  Follow-up Information   Follow up with [facility]. Schedule an appointment as soon as possible for a visit in 6 weeks. (Call with any questions or concerns.)    Specialty:  Obstetrics and Gynecology   Contact information:   [address] [place] [address] [phone]       Haroldine Laws 09/04/2013

## 2013-09-05 ENCOUNTER — Ambulatory Visit: Payer: Self-pay

## 2013-09-05 NOTE — Lactation Note (Signed)
This note was copied from the chart of Molly Ladell [name]. Lactation Consultation Note Follow up consult:  Mother called to view latch.  Mother was able to hand express good drops of colostrum prior.  Baby was able to latch easily with SNS, rhythmical sucks, swallows observed.  LS 8. Volume 19 ml gerber formula taken with SNS. Mother will continue to try breastfeeding first without SNS to stimulate milk supply and then post pump after every daytime feeding.  [facility]  Encouraged mother to call for further assistance.  Provided appt information sheet.   Patient Name: Molly Shannon ZOXWR'UToday's Date: 09/05/2013 Reason for consult: Follow-up assessment   Maternal Data    Feeding Feeding Type: Breast Fed  LATCH Score/Interventions Latch: Repeated attempts needed to sustain latch, nipple held in mouth throughout feeding, stimulation needed to elicit sucking reflex. Intervention(s): Skin to skin Intervention(s): Assist with latch  Audible Swallowing: Spontaneous and intermittent  Type of Nipple: Everted at rest and after stimulation Intervention(s): Double electric pump  Comfort (Breast/Nipple): Filling, red/small blisters or bruises, mild/mod discomfort  Problem noted: Filling;Mild/Moderate discomfort Interventions (Filling): Massage;Double electric pump;Frequent nursing  Hold (Positioning): No assistance needed to correctly position infant at breast. Intervention(s): Support Pillows  LATCH Score: 8  Lactation Tools Discussed/Used Tools: Supplemental Nutrition System;Pump   Consult Status Consult Status: Complete    Hardie PulleyBerkelhammer, Ruth Boschen 09/05/2013, 11:58 AM

## 2013-09-11 ENCOUNTER — Ambulatory Visit (HOSPITAL_COMMUNITY)
Admission: RE | Admit: 2013-09-11 | Discharge: 2013-09-11 | Disposition: A | Discharge status: Home / Self Care | Payer: Medicaid Other | Source: Ambulatory Visit | Attending: Obstetrics and Gynecology | Admitting: Obstetrics and Gynecology

## 2013-09-11 NOTE — Lactation Note (Signed)
Adult Lactation Consultation Outpatient Visit Note  Patient Name: Molly Shannon Date of Birth: Oct 04, 1985 Gestational Age at Delivery: 438w3d Type of Delivery:   Wyvonna Plumnfant Logan is 5410 days old.  DOB [birth year] Vaginal.  BW - 7 lbs, 1 oz.  GA - 40.4 weeks.  Infant had jaundice and was having difficulty latching in hospital.  Was discharged supplementing with Formula and breastmilk.  Last Peds visit 09/06/13 - infant weighed 6 lbs, 12 oz.  Today (5 days later) infant's weight is 7 lbs, 2 oz (increase of 6 oz in 5 days).  Supplementation was 4 times per day (12-20 ml each feeding, 65 ml total in 24 hrs) but quit supplementing on 09/06/13 (5 days ago) when her milk came in (gained 6 oz in past 5 days without any supplementation).  Mom has been pumping after feedings during day about 4 times per day for 5-10 min and obtaining 10-30 ml each pumping.  Outpatient appointment made while patient was still in hospital for follow-up due to jaundice and supplementation.    Breastfeeding History: Frequency of Breastfeeding: 8-10 times per day (q2-3 hrs) Length of Feeding: 30 min Voids: 6-8 Stools: 3-5 yellow seedy  Supplementing / Method: Pumping:  Type of Pump: [facility]IC Lactina   Frequency: 4 times per day 5 -10 min  Volume:  10-30 ml each pumping  Comments: Mom states she usually feeds on both sides with each feeding.  Mom does not have any complaints about feedings or growth.    Consultation Evaluation:  Initial Feeding Assessment: Pre-feed Weight: 3248 g (7 lbs, 2.5 oz) Left breast cross cradle Post-feed Weight: 3278 g Amount Transferred: 26 ml Comments:  Infant fed in a cross cradle position on left breast.  Mom used sandwiching technique and flanged bottom lip as she latched infant with a wide latch and depth.  Infant fed in a semi-consistent pattern (minimal stimulation needed) for 15 minutes.    Additional Feeding Assessment: Pre-feed Weight: 3278 g - Right breast cross cradle  Post-feed Weight:  3300  g Amount Transferred:26 ml Comments:  Small scab noted on tip of right nipple.  Mom states it was from an incorrect latch but states that it does not hurt after the first few seconds with latching.  Infant awoke with weighing and fed in a consistent pattern for 10 min; gulping heard with let-down and persistent swallows thereafter.  No pinching noted when infant came off breast.    Total Breast milk Transferred this Visit:  1152 ml (3610 days old)  Total Supplement Given: none  Additional Interventions: Mom has not been supplementing and infant has had a positive weight gain (1+ oz per day over past 5 days total of 6 oz weight gain).  Mom has good latching technique; latches with depth and flanged lips.  Discussed pumping strategies to decrease the number of times she is pumping per day if she wants.  Mom wants to continue some pumping during the day to build a milk reserve in freezer.  Discussed pumping strategies to maximize milk output and how to store milk.  Gave comfort gels to help scab on right breast heal; informed how to use comfort gels.  Follow-Up With Peds for Regular MD Visits [facility]     Lendon KaVann, Grady Lucci Walker 09/11/2013, 4:48 PM

## 2014-04-08 IMAGING — CR DG HAND COMPLETE 3+V*L*
3 series · 3 of 3 positions shown · non-contrast
Comparison: Concurrently obtained radiographs of the right wrist

CLINICAL DATA: Chronic left wrist pain, history of recurrent cyst

LEFT HAND - COMPLETE 3+ VIEW

[x hand pa left]
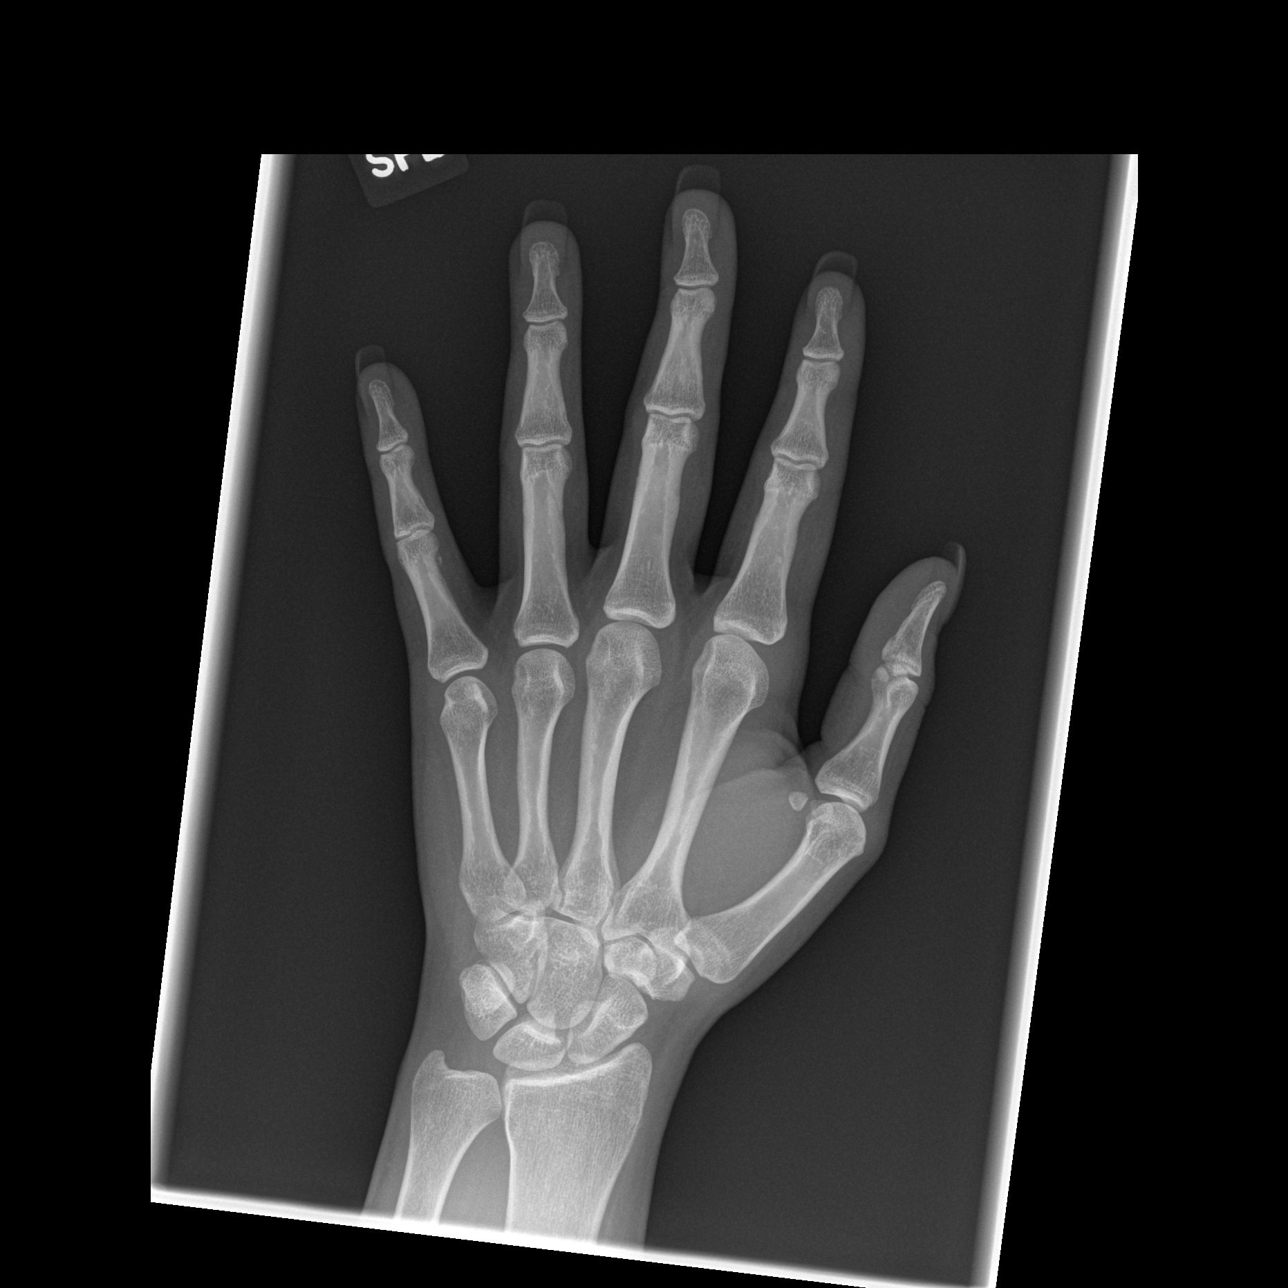

[x hand oblique left]
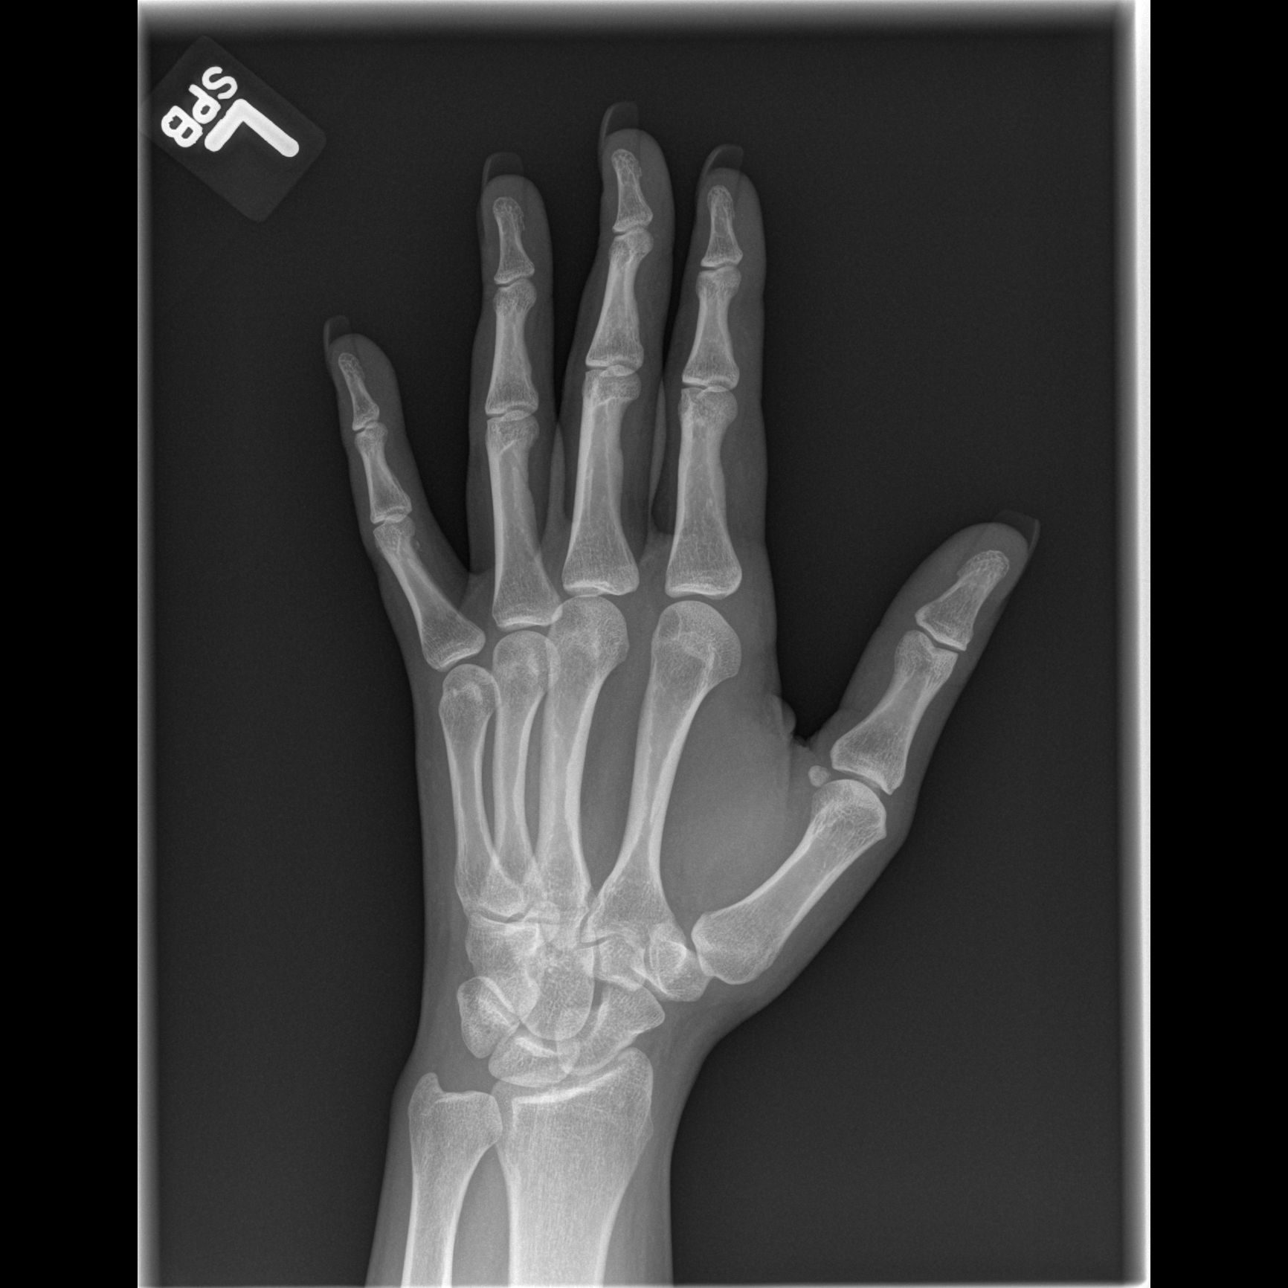

[x hand lat left]
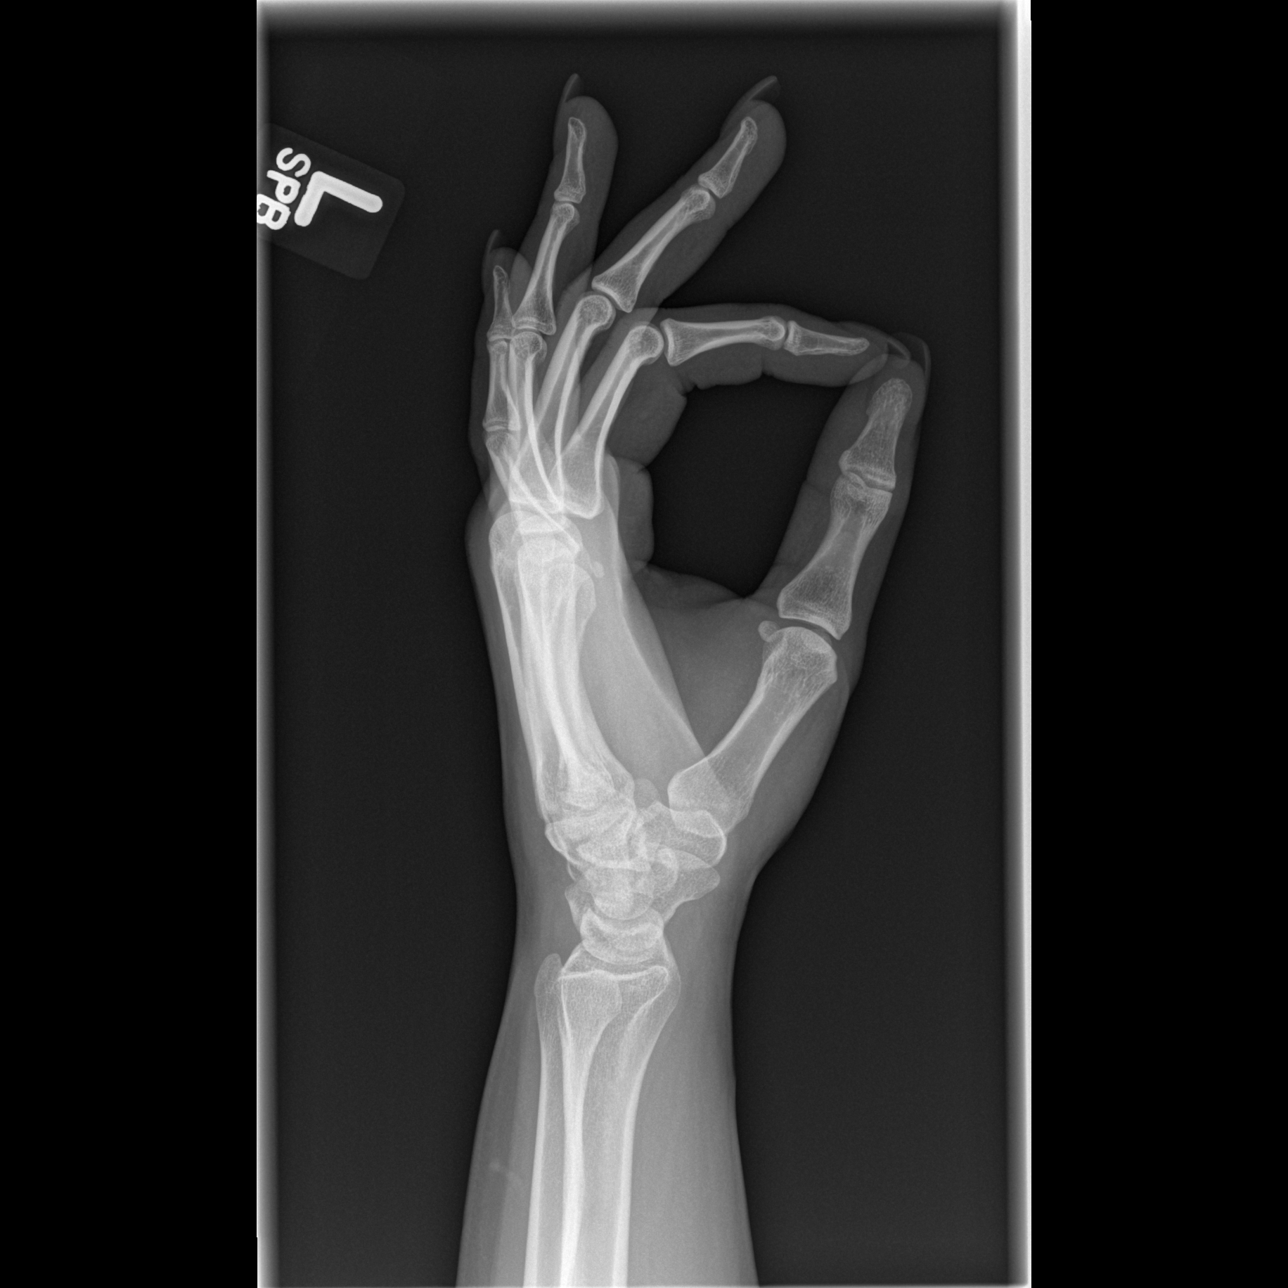

[3 of 3 positions shown; findings below may reference images not displayed]

FINDINGS: Normal bony mineralization.  No aggressive lytic or
blastic osseous lesion.  No abnormal periosteal reaction.  No acute
fracture or malalignment.  No focal soft tissue swelling
abnormality.  Joint spaces are maintained.  No inflammatory
erosions.
IMPRESSION: Normal study.

## 2014-05-14 ENCOUNTER — Encounter (HOSPITAL_COMMUNITY): Payer: Self-pay | Admitting: *Deleted

## 2014-07-13 HISTORY — PX: TUMOR EXCISION: SHX421

## 2016-05-01 ENCOUNTER — Encounter (HOSPITAL_BASED_OUTPATIENT_CLINIC_OR_DEPARTMENT_OTHER): Payer: Self-pay | Admitting: Emergency Medicine

## 2016-05-01 ENCOUNTER — Emergency Department (HOSPITAL_BASED_OUTPATIENT_CLINIC_OR_DEPARTMENT_OTHER)
Admission: EM | Admit: 2016-05-01 | Discharge: 2016-05-01 | Disposition: A | Discharge status: Home / Self Care | Payer: Medicaid Other | Attending: Emergency Medicine | Admitting: Emergency Medicine

## 2016-05-01 DIAGNOSIS — J45909 Unspecified asthma, uncomplicated: Secondary | ICD-10-CM | POA: Insufficient documentation

## 2016-05-01 DIAGNOSIS — F17210 Nicotine dependence, cigarettes, uncomplicated: Secondary | ICD-10-CM | POA: Diagnosis not present

## 2016-05-01 DIAGNOSIS — G43019 Migraine without aura, intractable, without status migrainosus: Secondary | ICD-10-CM | POA: Diagnosis not present

## 2016-05-01 DIAGNOSIS — Z791 Long term (current) use of non-steroidal anti-inflammatories (NSAID): Secondary | ICD-10-CM | POA: Insufficient documentation

## 2016-05-01 DIAGNOSIS — Z7951 Long term (current) use of inhaled steroids: Secondary | ICD-10-CM | POA: Diagnosis not present

## 2016-05-01 DIAGNOSIS — R51 Headache: Secondary | ICD-10-CM | POA: Diagnosis present

## 2016-05-01 DIAGNOSIS — G43011 Migraine without aura, intractable, with status migrainosus: Secondary | ICD-10-CM

## 2016-05-01 MED ORDER — SODIUM CHLORIDE 0.9 % IV BOLUS (SEPSIS)
1000.0000 mL | Freq: Once | INTRAVENOUS | Status: AC
  Administered 2016-05-01: 1000 mL via INTRAVENOUS

## 2016-05-01 MED ORDER — DEXAMETHASONE SODIUM PHOSPHATE 10 MG/ML IJ SOLN
10.0000 mg | Freq: Once | INTRAMUSCULAR | Status: AC
  Administered 2016-05-01: 10 mg via INTRAVENOUS
  Filled 2016-05-01: qty 1

## 2016-05-01 MED ORDER — KETOROLAC TROMETHAMINE 30 MG/ML IJ SOLN
30.0000 mg | Freq: Once | INTRAMUSCULAR | Status: AC
  Administered 2016-05-01: 30 mg via INTRAVENOUS
  Filled 2016-05-01: qty 1

## 2016-05-01 MED ORDER — METOCLOPRAMIDE HCL 5 MG/ML IJ SOLN
10.0000 mg | Freq: Once | INTRAMUSCULAR | Status: AC
  Administered 2016-05-01: 10 mg via INTRAVENOUS
  Filled 2016-05-01: qty 2

## 2016-05-01 NOTE — ED Provider Notes (Signed)
[facility] Provider Note   CSN: [id number] Arrival date & time: 05/01/16  0903     History   Chief Complaint Chief Complaint  Patient presents with  . Headache    x5 days    HPI Molly Shannon is a 30 y.o. female.  The history is provided by the patient.  Headache   This is a recurrent problem. Episode onset: 5 days ago. The problem occurs constantly. The problem has been gradually worsening. The headache is associated with bright light and loud noise. The pain is located in the frontal (left eye) region. The quality of the pain is described as sharp and throbbing. The pain is at a severity of 9/10. The pain is severe. The pain does not radiate. Associated symptoms include nausea. Pertinent negatives include no fever, no near-syncope, no palpitations, no syncope, no shortness of breath and no vomiting. Associated symptoms comments: No neck pain.  No tick bites.  No unilateral weakness or nubmness.. She has tried NSAIDs for the symptoms. The treatment provided no relief.    Past Medical History:  Diagnosis Date  . Asthma     Patient Active Problem List   Diagnosis Date Noted  . NSVD (normal spontaneous vaginal delivery) 09/02/2013    Past Surgical History:  Procedure Laterality Date  . CYST REMOVAL HAND    . 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 WISDOM TOOTH EXTRACTION      OB History    Gravida Para Term Preterm AB Living   2 1 1  0 0 1   SAB TAB Ectopic Multiple Live Births   0 0 0 0 1       Home Medications    Prior to Admission medications   Medication Sig Start Date End Date Taking? Authorizing Provider  albuterol-ipratropium (COMBIVENT) 18-103 MCG/ACT inhaler Inhale into the lungs every 4 (four) hours.   Yes Historical Provider, MD  ibuprofen (ADVIL,MOTRIN) 600 MG tablet Take 1 tablet (600 mg total) by mouth every 6 (six) hours as needed (pain scale < 4). 09/04/13  Yes Haroldine Laws, CNM  calcium carbonate (TUMS - DOSED IN MG ELEMENTAL CALCIUM)  500 MG chewable tablet Chew 1 tablet by mouth as needed for indigestion or heartburn.    Historical Provider, MD  ferrous sulfate 325 (65 FE) MG tablet Take 1 tablet (325 mg total) by mouth daily with breakfast. 09/04/13   Haroldine Laws, CNM  norethindrone (ORTHO MICRONOR) 0.35 MG tablet Take 1 tablet (0.35 mg total) by mouth daily. 09/04/13   Haroldine Laws, CNM  Prenatal Vit-Fe Fumarate-FA (PRENATAL MULTIVITAMIN) TABS tablet Take 1 tablet by mouth daily at 12 noon.    Historical Provider, MD  traMADol (ULTRAM) 50 MG tablet Take 1 tablet (50 mg total) by mouth every 6 (six) hours as needed for moderate pain. 09/04/13   Haroldine Laws, CNM    Family History History reviewed. No pertinent family history.  Social History Social History  Substance Use Topics  . Smoking status: Current Every Day Smoker    Packs/day: 1.00    Types: Cigarettes  . Smokeless tobacco: Never Used  . Alcohol use Yes     Comment: occ     Allergies   Penicillins; Percocet [oxycodone-acetaminophen]; and Vicodin [hydrocodone-acetaminophen]   Review of Systems Review of Systems  Constitutional: Negative for fever.  Respiratory: Negative for shortness of breath.   Cardiovascular: Negative for palpitations, syncope and near-syncope.  Gastrointestinal: Positive for nausea. Negative for vomiting.  Neurological: Positive for  headaches.  All other systems reviewed and are negative.    Physical Exam Updated Vital Signs BP 118/83 (BP Location: Right Arm)   Pulse 95   Temp 97.9 F (36.6 C) (Oral)   Resp 18   Ht 5\' 2"  (1.575 m)   Wt 185 lb (83.9 kg)   LMP 04/04/2016 (Exact Date)   SpO2 99%   BMI 33.84 kg/m   Physical Exam  Constitutional: She is oriented to person, place, and time. She appears well-developed and well-nourished. She appears distressed.  HENT:  Head: Normocephalic and atraumatic.  Mouth/Throat: Oropharynx is clear and moist.  Eyes: Conjunctivae and EOM are normal. Pupils are equal, round,  and reactive to light. Right eye exhibits no discharge. Left eye exhibits no discharge.  photophobia  Neck: Normal range of motion. Neck supple. No spinous process tenderness present. No neck rigidity. No Brudzinski's sign and no Kernig's sign noted.  Cardiovascular: Normal rate, normal heart sounds and intact distal pulses.   No murmur heard. Pulmonary/Chest: Effort normal and breath sounds normal. No respiratory distress. She has no wheezes. She has no rales.  Abdominal: Soft. She exhibits no distension. There is no tenderness.  Musculoskeletal: Normal range of motion. She exhibits no edema or tenderness.  Lymphadenopathy:    She has no cervical adenopathy.  Neurological: She is alert and oriented to person, place, and time. She has normal strength. No cranial nerve deficit or sensory deficit. Coordination and gait normal. GCS eye subscore is 4. GCS verbal subscore is 5. GCS motor subscore is 6.  Skin: Skin is warm and dry.  Psychiatric: She has a normal mood and affect. Her behavior is normal.  Nursing note and vitals reviewed.    ED Treatments / Results  Labs (all labs ordered are listed, but only abnormal results are displayed) Labs Reviewed - No data to display  EKG  EKG Interpretation None       Radiology No results found.  Procedures Procedures (including critical care time)  Medications Ordered in ED Medications  ketorolac (TORADOL) 30 MG/ML injection 30 mg (not administered)  metoCLOPramide (REGLAN) injection 10 mg (not administered)  sodium chloride 0.9 % bolus 1,000 mL (not administered)  dexamethasone (DECADRON) injection 10 mg (not administered)     Initial Impression / Assessment and Plan / ED Course  I have reviewed the triage vital signs and the nursing notes.  Pertinent labs & imaging results that were available during my care of the patient were reviewed by me and considered in my medical decision making (see chart for details).  Clinical Course     Pt with typical migraine HA without sx suggestive of SAH(sudden onset, worst of life, or deficits), infection, or cavernous vein thrombosis.  Normal neuro exam and vital signs. Will give HA cocktail and re-eval. 11:08 AM Pt feeling much better after meds and requesting to go home.  Final Clinical Impressions(s) / ED Diagnoses   Final diagnoses:  Intractable migraine without aura and with status migrainosus    New Prescriptions New Prescriptions   No medications on file     Gwyneth SproutWhitney Maguire Killmer, MD 05/01/16 1108

## 2016-05-01 NOTE — ED Notes (Signed)
MD at bedside. 

## 2016-05-01 NOTE — ED Triage Notes (Signed)
Patient c/o headache x5 days. "It feels like someone is taking a hot poker through my (left) eye". Patient has photosensitivity and noise sensitivity. Patient has taken "ibuprofen, tylenol, and Motrin" without relief. Patient reports nausea without emesis.
